# Patient Record
Sex: Female | Born: 1955 | Race: White | Hispanic: No | Marital: Married | Smoking: Never smoker
Health system: Southern US, Community
[De-identification: ages and names within clinical notes are randomized; demographics above are authoritative.]

## PROBLEM LIST (undated history)

## (undated) DIAGNOSIS — H269 Unspecified cataract: Secondary | ICD-10-CM

## (undated) DIAGNOSIS — F419 Anxiety disorder, unspecified: Secondary | ICD-10-CM

## (undated) DIAGNOSIS — K219 Gastro-esophageal reflux disease without esophagitis: Secondary | ICD-10-CM

## (undated) DIAGNOSIS — E785 Hyperlipidemia, unspecified: Secondary | ICD-10-CM

## (undated) DIAGNOSIS — I4891 Unspecified atrial fibrillation: Secondary | ICD-10-CM

## (undated) DIAGNOSIS — I10 Essential (primary) hypertension: Secondary | ICD-10-CM

## (undated) HISTORY — DX: Unspecified atrial fibrillation: I48.91

## (undated) HISTORY — DX: Essential (primary) hypertension: I10

## (undated) HISTORY — PX: BREAST SURGERY: SHX581

## (undated) HISTORY — DX: Gastro-esophageal reflux disease without esophagitis: K21.9

## (undated) HISTORY — DX: Hyperlipidemia, unspecified: E78.5

## (undated) HISTORY — DX: Anxiety disorder, unspecified: F41.9

## (undated) HISTORY — DX: Unspecified cataract: H26.9

## (undated) HISTORY — PX: EYE SURGERY: SHX253

## (undated) HISTORY — PX: COSMETIC SURGERY: SHX468

---

## 1995-03-11 HISTORY — PX: BREAST BIOPSY: SHX20

## 2004-03-10 HISTORY — PX: AUGMENTATION MAMMAPLASTY: SUR837

## 2013-10-14 DIAGNOSIS — I480 Paroxysmal atrial fibrillation: Secondary | ICD-10-CM | POA: Insufficient documentation

## 2013-10-14 DIAGNOSIS — I4891 Unspecified atrial fibrillation: Secondary | ICD-10-CM | POA: Insufficient documentation

## 2014-08-22 DIAGNOSIS — Z789 Other specified health status: Secondary | ICD-10-CM | POA: Insufficient documentation

## 2015-12-24 DIAGNOSIS — M5416 Radiculopathy, lumbar region: Secondary | ICD-10-CM | POA: Insufficient documentation

## 2017-01-09 LAB — HM COLONOSCOPY

## 2018-03-24 LAB — HM PAP SMEAR: HM Pap smear: NEGATIVE

## 2018-04-24 NOTE — Progress Notes (Signed)
Subjective:    Patient ID: Deborah Farmer, female    DOB: 14-Aug-1955, 63 y.o.   MRN: [id number]  HPI  Deborah Farmer is here to establish as a new pt.  She is a pleasant 63 year old female. PMH: AFib, HLD, GERD She reports A fib well controlled on Atenolol 59m QD and Flecainide 10662mBID.  She reports intermittent palpitations, denies CP/dyspnea She is followed by Cardiology annually She reports very limited caffeine intake and denies any tobacco/vape use She has been on rosuvastatin 62m80mD for HLD- denies myalgia's She reports significant life stress with caring for ailing 93 71ar old mother. Mother has CAD with PCI, AFib, T2D, Multiple Myeloma, and CHF- palliative has been initiated and "she have been given less than 6 months". She and her two other siblings are sharing "full care giver responsibility" for their mother and it has been increasingly exhausting. She reports a strained relationship with her husband of 6 years- he is now deaf, communication is difficult and he is unsupportive of her efforts to care for her mother. She denies regular exercise, however follows a heart healthy diet. [facility] using Alprazolam 0.262m23mry sparingly   Patient Care Team    Relationship Specialty Notifications Start End  DanfEsaw Grandchild PCP - General Family Medicine  04/26/18   AryaJalene Mullet Referring Physician Obstetrics and Gynecology  04/27/18   SchwNewell Coral Referring Physician Gastroenterology  04/27/18 04/27/18  MeehGeraldo Docker  Vascular Surgery  04/27/18 04/27/18    Patient Active Problem List   Diagnosis Date Noted  . Healthcare maintenance 04/27/2018  . Anxiety 04/27/2018  . Stress 04/27/2018  . Atrial fibrillation (HCC)[place]/18/2020  . Hyperlipidemia 04/27/2018     Past Medical History:  Diagnosis Date  . Atrial fibrillation (HCC)[place]. Hyperlipidemia      Past Surgical History:  Procedure Laterality Date  .  BREAST SURGERY     biopsy and augmentation     Family History  Problem Relation Age of Onset  . Heart attack Mother   . Hypertension Mother   . Congestive Heart Failure Mother   . Heart attack Father   . Diabetes Sister   . Diabetes Brother      Social History   Substance and Sexual Activity  Drug Use Never     Social History   Substance and Sexual Activity  Alcohol Use Yes  . Alcohol/week: 2.0 standard drinks  . Types: 2 Glasses of wine per week     Social History   Tobacco Use  Smoking Status Never Smoker  Smokeless Tobacco Never Used     Outpatient Encounter Medications as of 04/27/2018  Medication Sig  . ALPRAZolam (XANAX) 0.25 MG tablet Take 0.25 mg by mouth at bedtime as needed for anxiety.  . atMarland Kitchennolol (TENORMIN) 25 MG tablet Take 1 tablet by mouth daily.  . DEMarland KitchenILANT 60 MG capsule Take 1 capsule by mouth daily.  . famotidine (PEPCID) 40 MG tablet Take 1 tablet by mouth daily.  . flecainide (TAMBOCOR) 100 MG tablet Take 1 tablet by mouth 2 (two) times daily.  . Lactobacillus Rhamnosus, GG, (CULTURELLE) CAPS Take 1 capsule by mouth daily.  . Multiple Vitamins-Minerals (MULTIVITAMIN ADULT PO) Take 1 tablet by mouth daily.  . rosuvastatin (CRESTOR) 5 MG tablet Take 1 tablet by mouth daily.  . valACYclovir (VALTREX) 500 MG tablet Take by mouth as needed.   No facility-administered  encounter medications on file as of 04/27/2018.     Allergies: Patient has no allergy information on record.  Body mass index is 22.39 kg/m.  Blood pressure 118/74, pulse 66, temperature 99.2 F (37.3 C), temperature source Oral, height 5' 8.5" (1.74 m), weight 149 lb 6.4 oz (67.8 kg), SpO2 99 %.  Review of Systems  Constitutional: Positive for fatigue. Negative for activity change, appetite change, chills, diaphoresis, fever and unexpected weight change.  Eyes: Negative for visual disturbance.  Respiratory: Negative for cough, chest tightness, shortness of breath, wheezing  and stridor.   Cardiovascular: Negative for chest pain, palpitations and leg swelling.  Gastrointestinal: Negative for abdominal distention, abdominal pain, blood in stool, constipation, diarrhea, nausea and vomiting.  Endocrine: Negative for cold intolerance, heat intolerance, polydipsia, polyphagia and polyuria.  Genitourinary: Negative for difficulty urinating and flank pain.  Musculoskeletal: Positive for arthralgias and myalgias. Negative for back pain, neck pain and neck stiffness.  Skin: Negative for color change, pallor, rash and wound.  Neurological: Negative for dizziness and headaches.  Hematological: Does not bruise/bleed easily.  Psychiatric/Behavioral: Positive for dysphoric mood. Negative for agitation, behavioral problems, confusion, decreased concentration, hallucinations, self-injury, sleep disturbance and suicidal ideas. The patient is nervous/anxious. The patient is not hyperactive.        Objective:   Physical Exam Vitals signs and nursing note reviewed.  Constitutional:      General: She is not in acute distress.    Appearance: She is not ill-appearing, toxic-appearing or diaphoretic.  HENT:     Head: Normocephalic and atraumatic.  Cardiovascular:     Pulses: Normal pulses.     Heart sounds: Normal heart sounds. No murmur. No friction rub. No gallop.   Pulmonary:     Effort: Pulmonary effort is normal. No respiratory distress.     Breath sounds: Normal breath sounds. No stridor. No wheezing, rhonchi or rales.  Chest:     Chest wall: No tenderness.  Skin:    Capillary Refill: Capillary refill takes less than 2 seconds.  Neurological:     Mental Status: She is alert and oriented to person, place, and time.  Psychiatric:        Mood and Affect: Mood normal.        Behavior: Behavior normal.        Thought Content: Thought content normal.        Judgment: Judgment normal.       Assessment & Plan:   1. Healthcare maintenance   2. Need for hepatitis C  screening test   3. Screening for HIV (human immunodeficiency virus)   4. Screening for breast cancer   5. Hyperlipidemia, unspecified hyperlipidemia type   6. Stress   7. Anxiety   8. Atrial fibrillation, unspecified type [facility])     Healthcare maintenance Continue all medications as directed. Continue annual Cardiology follow-up. Remain well hydrated and follow Mediterranean diet. Behavioral Health referral placed. Increase regular exercise.  Recommend at least 30 minutes daily, 5 days per week of walking, jogging, biking, swimming, YouTube/Pinterest workout videos. Please schedule complete physical in 2 months, fasting lab appt the week prior.  Hyperlipidemia Currently on Rosuvastatin 27m QD Denies myalgia's   Atrial fibrillation (HCC) Well controlled on Atenolol 236mQD and Flecainide 10045mID.  She reports intermittent palpitations, denies CP/dyspnea She is followed by Cardiology annually    FOLLOW-UP:  Return in about 2 months (around 06/26/2018) for Fasting Labs, CPE.

## 2018-04-27 ENCOUNTER — Ambulatory Visit (INDEPENDENT_AMBULATORY_CARE_PROVIDER_SITE_OTHER): Payer: 59 | Admitting: Adult Health

## 2018-04-27 ENCOUNTER — Encounter: Payer: Self-pay | Admitting: Adult Health

## 2018-04-27 VITALS — BP 118/74 | HR 66 | Temp 99.2°F | Ht 68.5 in | Wt 149.4 lb

## 2018-04-27 DIAGNOSIS — F411 Generalized anxiety disorder: Secondary | ICD-10-CM | POA: Insufficient documentation

## 2018-04-27 DIAGNOSIS — E785 Hyperlipidemia, unspecified: Secondary | ICD-10-CM

## 2018-04-27 DIAGNOSIS — I4891 Unspecified atrial fibrillation: Secondary | ICD-10-CM

## 2018-04-27 DIAGNOSIS — Z0000 Encounter for general adult medical examination without abnormal findings: Secondary | ICD-10-CM

## 2018-04-27 DIAGNOSIS — Z1159 Encounter for screening for other viral diseases: Secondary | ICD-10-CM

## 2018-04-27 DIAGNOSIS — F419 Anxiety disorder, unspecified: Secondary | ICD-10-CM

## 2018-04-27 DIAGNOSIS — F439 Reaction to severe stress, unspecified: Secondary | ICD-10-CM

## 2018-04-27 DIAGNOSIS — Z1239 Encounter for other screening for malignant neoplasm of breast: Secondary | ICD-10-CM | POA: Diagnosis not present

## 2018-04-27 DIAGNOSIS — Z114 Encounter for screening for human immunodeficiency virus [HIV]: Secondary | ICD-10-CM

## 2018-04-27 NOTE — Assessment & Plan Note (Addendum)
Well controlled on Atenolol 25mg  QD and Flecainide 100mg  BID.  She reports intermittent palpitations, denies CP/dyspnea She is followed by Cardiology annually

## 2018-04-27 NOTE — Assessment & Plan Note (Signed)
Currently on Rosuvastatin 5mg  QD Denies myalgia's

## 2018-04-27 NOTE — Patient Instructions (Signed)
Mediterranean Diet A Mediterranean diet refers to food and lifestyle choices that are based on the traditions of countries located on the [employer]. This way of eating has been shown to help prevent certain conditions and improve outcomes for people who have chronic diseases, like kidney disease and heart disease. What are tips for following this plan? Lifestyle  Cook and eat meals together with your family, when possible.  Drink enough fluid to keep your urine clear or pale yellow.  Be physically active every day. This includes: ? Aerobic exercise like running or swimming. ? Leisure activities like gardening, walking, or housework.  Get 7-8 hours of sleep each night.  If recommended by your health care provider, drink red wine in moderation. This means 1 glass a day for nonpregnant women and 2 glasses a day for men. A glass of wine equals 5 oz (150 mL). Reading food labels   Check the serving size of packaged foods. For foods such as rice and pasta, the serving size refers to the amount of cooked product, not dry.  Check the total fat in packaged foods. Avoid foods that have saturated fat or trans fats.  Check the ingredients list for added sugars, such as corn syrup. Shopping  At the grocery store, buy most of your food from the areas near the walls of the store. This includes: ? Fresh fruits and vegetables (produce). ? Grains, beans, nuts, and seeds. Some of these may be available in unpackaged forms or large amounts (in bulk). ? Fresh seafood. ? Poultry and eggs. ? Low-fat dairy products.  Buy whole ingredients instead of prepackaged foods.  Buy fresh fruits and vegetables in-season from local farmers markets.  Buy frozen fruits and vegetables in resealable bags.  If you do not have access to quality fresh seafood, buy precooked frozen shrimp or canned fish, such as tuna, salmon, or sardines.  Buy small amounts of raw or cooked vegetables, salads, or olives from  the deli or salad bar at your store.  Stock your pantry so you always have certain foods on hand, such as olive oil, canned tuna, canned tomatoes, rice, pasta, and beans. Cooking  Cook foods with extra-virgin olive oil instead of using butter or other vegetable oils.  Have meat as a side dish, and have vegetables or grains as your main dish. This means having meat in small portions or adding small amounts of meat to foods like pasta or stew.  Use beans or vegetables instead of meat in common dishes like chili or lasagna.  Experiment with different cooking methods. Try roasting or broiling vegetables instead of steaming or sauteing them.  Add frozen vegetables to soups, stews, pasta, or rice.  Add nuts or seeds for added healthy fat at each meal. You can add these to yogurt, salads, or vegetable dishes.  Marinate fish or vegetables using olive oil, lemon juice, garlic, and fresh herbs. Meal planning   Plan to eat 1 vegetarian meal one day each week. Try to work up to 2 vegetarian meals, if possible.  Eat seafood 2 or more times a week.  Have healthy snacks readily available, such as: ? Vegetable sticks with hummus. ? [place] yogurt. ? Fruit and nut trail mix.  Eat balanced meals throughout the week. This includes: ? Fruit: 2-3 servings a day ? Vegetables: 4-5 servings a day ? Low-fat dairy: 2 servings a day ? Fish, poultry, or lean meat: 1 serving a day ? Beans and legumes: 2 or more servings a week ?  Nuts and seeds: 1-2 servings a day ? Whole grains: 6-8 servings a day ? Extra-virgin olive oil: 3-4 servings a day  Limit red meat and sweets to only a few servings a month What are my food choices?  Mediterranean diet ? Recommended ? Grains: Whole-grain pasta. Brown rice. Bulgar wheat. Polenta. Couscous. Whole-wheat bread. Orpah Cobb. ? Vegetables: Artichokes. Beets. Broccoli. Cabbage. Carrots. Eggplant. Green beans. Chard. Kale. Spinach. Onions. Leeks. Peas. Squash.  Tomatoes. Peppers. Radishes. ? Fruits: Apples. Apricots. Avocado. Berries. Bananas. Cherries. Dates. Figs. Grapes. Lemons. Melon. Oranges. Peaches. Plums. Pomegranate. ? Meats and other protein foods: Beans. Almonds. Sunflower seeds. Pine nuts. Peanuts. Cod. Salmon. Scallops. Shrimp. Tuna. Tilapia. Clams. Oysters. Eggs. ? Dairy: Low-fat milk. Cheese. Greek yogurt. ? Beverages: Water. Red wine. Herbal tea. ? Fats and oils: Extra virgin olive oil. Avocado oil. Grape seed oil. ? Sweets and desserts: [place] yogurt with honey. Baked apples. Poached pears. Trail mix. ? Seasoning and other foods: Basil. Cilantro. Coriander. Cumin. Mint. Parsley. Sage. Rosemary. Tarragon. Garlic. Oregano. Thyme. Pepper. Balsalmic vinegar. Tahini. Hummus. Tomato sauce. Olives. Mushrooms. ? Limit these ? Grains: Prepackaged pasta or rice dishes. Prepackaged cereal with added sugar. ? Vegetables: Deep fried potatoes (french fries). ? Fruits: Fruit canned in syrup. ? Meats and other protein foods: Beef. Pork. Lamb. Poultry with skin. Hot dogs. Tomasa Blase. ? Dairy: Ice cream. Sour cream. Whole milk. ? Beverages: Juice. Sugar-sweetened soft drinks. Beer. Liquor and spirits. ? Fats and oils: Butter. Canola oil. Vegetable oil. Beef fat (tallow). Lard. ? Sweets and desserts: Cookies. Cakes. Pies. Candy. ? Seasoning and other foods: Mayonnaise. Premade sauces and marinades. ? The items listed may not be a complete list. Talk with your dietitian about what dietary choices are right for you. Summary  The Mediterranean diet includes both food and lifestyle choices.  Eat a variety of fresh fruits and vegetables, beans, nuts, seeds, and whole grains.  Limit the amount of red meat and sweets that you eat.  Talk with your health care provider about whether it is safe for you to drink red wine in moderation. This means 1 glass a day for nonpregnant women and 2 glasses a day for men. A glass of wine equals 5 oz (150 mL). This information  is not intended to replace advice given to you by your health care provider. Make sure you discuss any questions you have with your health care provider. Document Released: 10/18/2015 Document Revised: 11/20/2015 Document Reviewed: 10/18/2015 Elsevier Interactive Patient Education  2019 [employer].  Continue all medications as directed. Continue annual Cardiology follow-up. Remain well hydrated and follow Mediterranean diet. Behavioral Health referral placed. Increase regular exercise.  Recommend at least 30 minutes daily, 5 days per week of walking, jogging, biking, swimming, YouTube/Pinterest workout videos. Please schedule complete physical in 2 months, fasting lab appt the week prior. WELCOME TO THE PRACTICE!

## 2018-04-27 NOTE — Assessment & Plan Note (Signed)
Continue all medications as directed. Continue annual Cardiology follow-up. Remain well hydrated and follow Mediterranean diet. Behavioral Health referral placed. Increase regular exercise.  Recommend at least 30 minutes daily, 5 days per week of walking, jogging, biking, swimming, YouTube/Pinterest workout videos. Please schedule complete physical in 2 months, fasting lab appt the week prior.

## 2018-06-30 ENCOUNTER — Other Ambulatory Visit: Payer: 59

## 2018-07-14 ENCOUNTER — Other Ambulatory Visit: Payer: 59

## 2018-07-20 ENCOUNTER — Encounter: Payer: 59 | Admitting: Adult Health

## 2018-07-26 LAB — HM MAMMOGRAPHY

## 2018-10-14 ENCOUNTER — Other Ambulatory Visit: Payer: Self-pay

## 2018-10-14 ENCOUNTER — Other Ambulatory Visit: Payer: 59

## 2018-10-14 DIAGNOSIS — E785 Hyperlipidemia, unspecified: Secondary | ICD-10-CM

## 2018-10-14 DIAGNOSIS — Z114 Encounter for screening for human immunodeficiency virus [HIV]: Secondary | ICD-10-CM

## 2018-10-14 DIAGNOSIS — Z0000 Encounter for general adult medical examination without abnormal findings: Secondary | ICD-10-CM

## 2018-10-14 DIAGNOSIS — Z1159 Encounter for screening for other viral diseases: Secondary | ICD-10-CM

## 2018-10-15 LAB — CBC WITH DIFFERENTIAL/PLATELET
Basophils Absolute: 0 10*3/uL (ref 0.0–0.2)
Basos: 1 %
EOS (ABSOLUTE): 0.1 10*3/uL (ref 0.0–0.4)
Eos: 1 %
Hematocrit: 36.6 % (ref 34.0–46.6)
Hemoglobin: 12.2 g/dL (ref 11.1–15.9)
Immature Grans (Abs): 0 10*3/uL (ref 0.0–0.1)
Immature Granulocytes: 0 %
Lymphocytes Absolute: 1.3 10*3/uL (ref 0.7–3.1)
Lymphs: 30 %
MCH: 29.3 pg (ref 26.6–33.0)
MCHC: 33.3 g/dL (ref 31.5–35.7)
MCV: 88 fL (ref 79–97)
Monocytes Absolute: 0.3 10*3/uL (ref 0.1–0.9)
Monocytes: 6 %
Neutrophils Absolute: 2.6 10*3/uL (ref 1.4–7.0)
Neutrophils: 62 %
Platelets: 193 10*3/uL (ref 150–450)
RBC: 4.16 x10E6/uL (ref 3.77–5.28)
RDW: 12 % (ref 11.7–15.4)
WBC: 4.2 10*3/uL (ref 3.4–10.8)

## 2018-10-15 LAB — HIV ANTIBODY (ROUTINE TESTING W REFLEX): HIV Screen 4th Generation wRfx: NONREACTIVE

## 2018-10-15 LAB — COMPREHENSIVE METABOLIC PANEL
ALT: 29 IU/L (ref 0–32)
AST: 35 IU/L (ref 0–40)
Albumin/Globulin Ratio: 2.1 (ref 1.2–2.2)
Albumin: 4.6 g/dL (ref 3.8–4.8)
Alkaline Phosphatase: 58 IU/L (ref 39–117)
BUN/Creatinine Ratio: 22 (ref 12–28)
BUN: 18 mg/dL (ref 8–27)
Bilirubin Total: 0.6 mg/dL (ref 0.0–1.2)
CO2: 27 mmol/L (ref 20–29)
Calcium: 9.6 mg/dL (ref 8.7–10.3)
Chloride: 102 mmol/L (ref 96–106)
Creatinine, Ser: 0.82 mg/dL (ref 0.57–1.00)
GFR calc Af Amer: 89 mL/min/{1.73_m2} (ref >59)
GFR calc non Af Amer: 77 mL/min/{1.73_m2} (ref >59)
Globulin, Total: 2.2 g/dL (ref 1.5–4.5)
Glucose: 94 mg/dL (ref 65–99)
Potassium: 4.6 mmol/L (ref 3.5–5.2)
Sodium: 142 mmol/L (ref 134–144)
Total Protein: 6.8 g/dL (ref 6.0–8.5)

## 2018-10-15 LAB — TSH: TSH: 0.918 u[IU]/mL (ref 0.450–4.500)

## 2018-10-15 LAB — LIPID PANEL
Chol/HDL Ratio: 3.4 ratio (ref 0.0–4.4)
Cholesterol, Total: 165 mg/dL (ref 100–199)
HDL: 49 mg/dL (ref >39)
LDL Calculated: 86 mg/dL (ref 0–99)
Triglycerides: 152 mg/dL — ABNORMAL HIGH (ref 0–149)
VLDL Cholesterol Cal: 30 mg/dL (ref 5–40)

## 2018-10-15 LAB — HEMOGLOBIN A1C
Est. average glucose Bld gHb Est-mCnc: 103 mg/dL
Hgb A1c MFr Bld: 5.2 % (ref 4.8–5.6)

## 2018-10-15 LAB — HEPATITIS C ANTIBODY: Hep C Virus Ab: <0.1 s/co ratio (ref 0.0–0.9)

## 2018-10-17 NOTE — Progress Notes (Signed)
Subjective:    Patient ID: Deborah Farmer, female    DOB: 1955/12/04, 63 y.o.   MRN: [id number]  HPI:04/27/2018 OV: Deborah Farmer is here to establish as a new pt.  She is a pleasant 63 year old female. PMH: AFib, HLD, GERD She reports A fib well controlled on Atenolol 62m QD and Flecainide 1077mBID.  She reports intermittent palpitations, denies CP/dyspnea She is followed by Cardiology annually She reports very limited caffeine intake and denies any tobacco/vape use She has been on rosuvastatin 77m777mD for HLD- denies myalgia's She reports significant life stress with caring for ailing 93 44ar old mother. Mother has CAD with PCI, AFib, T2D, Multiple Myeloma, and CHF- palliative has been initiated and "she have been given less than 6 months". She and her two other siblings are sharing "full care giver responsibility" for their mother and it has been increasingly exhausting. She reports a strained relationship with her husband of 6 years- he is now deaf, communication is difficult and he is unsupportive of her efforts to care for her mother. She denies regular exercise, however follows a heart healthy diet. [facility] using Alprazolam 0.277m30mry sparingly - 1/2 tablets per month    10/18/2018 OV: Deborah Farmer for CPE Her mother passed away 4 weeks ago, she is coping with the loss well. She has resumed walking 3-4 times/week, >1 mile She reports drinking "only a few sips of water/day" She follows heart healthy diet She was recently seen by cardiologist- no change to medication She is assisting with care of her 45 y61r old daughter-in-law: breast ca She reports improved relationship with her husband  10/14/2018 Labs: HIV- Neg  Hep C- Neg  TSH-WNL, 0.918  The 10-year ASCVD risk score (GofMikey BussingJr., et al., 2013) is: 2.6%  Values used to calculate the score:   Age: 89 y69rs   Sex: Female   Is Non-Hispanic African American: No    Diabetic: No   Tobacco smoker: No   Systolic Blood Pressure: 104 182g   Is BP treated: No   HDL Cholesterol: 49 mg/dL   Total Cholesterol: 165 mg/dL  LDL-86  A1c-WNL, 5.2  CMP-stable  CBC-stable Healthcare Maintenance: PAP-UTD, last 03/2018-normal Mammogram-Tracking Down report Colonoscopy-UTD, last 2018, repeat 10 years Immunizations-Shingrix updated today LDCT-N/A  Patient Care Team    Relationship Specialty Notifications Start End  DanfEsaw Grandchild PCP - General Family Medicine  04/26/18   AryaJalene Mullet Referring Physician Obstetrics and Gynecology  04/27/18     Patient Active Problem List   Diagnosis Date Noted  . Healthcare maintenance 04/27/2018  . Anxiety 04/27/2018  . Stress 04/27/2018  . Atrial fibrillation (HCC)[place]/18/2020  . Hyperlipidemia 04/27/2018     Past Medical History:  Diagnosis Date  . Atrial fibrillation (HCC)[place]. Hyperlipidemia      Past Surgical History:  Procedure Laterality Date  . BREAST SURGERY     biopsy and augmentation     Family History  Problem Relation Age of Onset  . Heart attack Mother   . Hypertension Mother   . Congestive Heart Failure Mother   . Heart attack Father   . Diabetes Sister   . Diabetes Brother      Social History   Substance and Sexual Activity  Drug Use Never     Social History   Substance and Sexual Activity  Alcohol Use Yes  . Alcohol/week: 2.0 standard drinks  .  Types: 2 Glasses of wine per week     Social History   Tobacco Use  Smoking Status Never Smoker  Smokeless Tobacco Never Used     Outpatient Encounter Medications as of 10/18/2018  Medication Sig  . atenolol (TENORMIN) 25 MG tablet Take 1 tablet by mouth daily.  Marland Kitchen DEXILANT 60 MG capsule Take 1 capsule by mouth daily.  . famotidine (PEPCID) 40 MG tablet Take 1 tablet by mouth daily.  . flecainide (TAMBOCOR) 100 MG tablet Take 1 tablet by mouth 2 (two) times daily.  . Lactobacillus Rhamnosus,  GG, (CULTURELLE) CAPS Take 1 capsule by mouth daily.  . Multiple Vitamins-Minerals (MULTIVITAMIN ADULT PO) Take 1 tablet by mouth daily.  . rosuvastatin (CRESTOR) 5 MG tablet Take 1 tablet by mouth daily.  . valACYclovir (VALTREX) 500 MG tablet Take by mouth as needed.  . [DISCONTINUED] ALPRAZolam (XANAX) 0.25 MG tablet Take 0.25 mg by mouth at bedtime as needed for anxiety.  . ALPRAZolam (NIRAVAM) 0.25 MG dissolvable tablet Take 1 tablet (0.25 mg total) by mouth at bedtime as needed for anxiety.   No facility-administered encounter medications on file as of 10/18/2018.     Allergies: Patient has no allergy information on record.  Body mass index is 22.57 kg/m.  Blood pressure 108/72, pulse (!) 55, temperature 98.6 F (37 C), temperature source Oral, height 5' 8.5" (1.74 m), weight 150 lb 9.6 oz (68.3 kg), SpO2 99 %.  Review of Systems  Constitutional: Positive for fatigue. Negative for activity change, appetite change, chills, diaphoresis, fever and unexpected weight change.  HENT: Negative for congestion.   Eyes: Negative for visual disturbance.  Respiratory: Negative for cough, chest tightness, shortness of breath, wheezing and stridor.   Cardiovascular: Negative for chest pain, palpitations and leg swelling.  Gastrointestinal: Negative for abdominal distention, abdominal pain, blood in stool, constipation, diarrhea, nausea and vomiting.  Endocrine: Negative for cold intolerance, heat intolerance, polydipsia, polyphagia and polyuria.  Genitourinary: Negative for difficulty urinating and flank pain.  Musculoskeletal: Negative for arthralgias, back pain, gait problem, joint swelling, myalgias, neck pain and neck stiffness.  Skin: Negative for color change, pallor, rash and wound.  Neurological: Negative for dizziness and headaches.  Hematological: Negative for adenopathy. Does not bruise/bleed easily.  Psychiatric/Behavioral: Negative for agitation, behavioral problems, confusion,  decreased concentration, dysphoric mood, hallucinations, self-injury, sleep disturbance and suicidal ideas. The patient is not nervous/anxious and is not hyperactive.        Objective:   Physical Exam Vitals signs and nursing note reviewed.  Constitutional:      General: She is not in acute distress.    Appearance: Normal appearance. She is normal weight. She is not ill-appearing, toxic-appearing or diaphoretic.  HENT:     Head: Normocephalic and atraumatic.     Right Ear: Tympanic membrane, ear canal and external ear normal. There is no impacted cerumen.     Left Ear: Tympanic membrane, ear canal and external ear normal. There is no impacted cerumen.     Nose: Nose normal. No congestion.     Mouth/Throat:     Mouth: Mucous membranes are dry.     Pharynx: No oropharyngeal exudate or posterior oropharyngeal erythema.  Eyes:     Extraocular Movements: Extraocular movements intact.     Conjunctiva/sclera: Conjunctivae normal.     Pupils: Pupils are equal, round, and reactive to light.  Neck:     Musculoskeletal: Normal range of motion and neck supple. No muscular tenderness.  Cardiovascular:  Rate and Rhythm: Normal rate and regular rhythm.     Pulses: Normal pulses.     Heart sounds: Normal heart sounds. No murmur. No gallop.   Pulmonary:     Effort: Pulmonary effort is normal. No respiratory distress.     Breath sounds: Normal breath sounds. No stridor. No wheezing, rhonchi or rales.  Chest:     Chest wall: No tenderness.  Abdominal:     General: Abdomen is flat. Bowel sounds are normal.     Palpations: Abdomen is soft.     Tenderness: There is no abdominal tenderness.  Musculoskeletal: Normal range of motion.  Skin:    General: Skin is warm.     Capillary Refill: Capillary refill takes less than 2 seconds.  Neurological:     Mental Status: She is alert and oriented to person, place, and time.  Psychiatric:        Mood and Affect: Mood normal.        Behavior: Behavior  normal.        Thought Content: Thought content normal.        Judgment: Judgment normal.        Assessment & Plan:   1. Encounter for well woman exam with routine gynecological exam   2. Healthcare maintenance   3. Need for zoster vaccination     Healthcare maintenance Continue all medications as directed. Increase water intake, strive for half of your body weight in ounces/day. Continue walking and add in yoga practice. Referral to local OB/GYN placed, someone from that practice will call you to schedule that appt. Follow-up in 6 months. Continue to social distance and wear a mask when in public. So very sorry for the loss of your mother.    FOLLOW-UP:  Return in about 6 months (around 04/20/2019) for Regular Follow Up, HTN.

## 2018-10-18 ENCOUNTER — Ambulatory Visit (INDEPENDENT_AMBULATORY_CARE_PROVIDER_SITE_OTHER): Payer: 59 | Admitting: Adult Health

## 2018-10-18 ENCOUNTER — Encounter: Payer: Self-pay | Admitting: Adult Health

## 2018-10-18 ENCOUNTER — Other Ambulatory Visit: Payer: Self-pay

## 2018-10-18 VITALS — BP 108/72 | HR 55 | Temp 98.6°F | Ht 68.5 in | Wt 150.6 lb

## 2018-10-18 DIAGNOSIS — Z23 Encounter for immunization: Secondary | ICD-10-CM

## 2018-10-18 DIAGNOSIS — Z01419 Encounter for gynecological examination (general) (routine) without abnormal findings: Secondary | ICD-10-CM | POA: Diagnosis not present

## 2018-10-18 DIAGNOSIS — Z0000 Encounter for general adult medical examination without abnormal findings: Secondary | ICD-10-CM

## 2018-10-18 MED ORDER — ALPRAZOLAM 0.25 MG PO TBDP: 0.2500 mg | ORAL_TABLET | Freq: Every evening | ORAL | 0 refills | Status: DC | PRN

## 2018-10-18 NOTE — Assessment & Plan Note (Signed)
Continue all medications as directed. Increase water intake, strive for half of your body weight in ounces/day. Continue walking and add in yoga practice. Referral to local OB/GYN placed, someone from that practice will call you to schedule that appt. Follow-up in 6 months. Continue to social distance and wear a mask when in public. So very sorry for the loss of your mother.

## 2018-10-18 NOTE — Patient Instructions (Signed)
Preventive Care for Adults, Female  A healthy lifestyle and preventive care can promote health and wellness. Preventive health guidelines for women include the following key practices.   A routine yearly physical is a good way to check with your health care provider about your health and preventive screening. It is a chance to share any concerns and updates on your health and to receive a thorough exam.   Visit your dentist for a routine exam and preventive care every 6 months. Brush your teeth twice a day and floss once a day. Good oral hygiene prevents tooth decay and gum disease.   The frequency of eye exams is based on your age, health, family medical history, use of contact lenses, and other factors. Follow your health care provider's recommendations for frequency of eye exams.   Eat a healthy diet. Foods like vegetables, fruits, whole grains, low-fat dairy products, and lean protein foods contain the nutrients you need without too many calories. Decrease your intake of foods high in solid fats, added sugars, and salt. Eat the right amount of calories for you.Get information about a proper diet from your health care provider, if necessary.   Regular physical exercise is one of the most important things you can do for your health. Most adults should get at least 150 minutes of moderate-intensity exercise (any activity that increases your heart rate and causes you to sweat) each week. In addition, most adults need muscle-strengthening exercises on 2 or more days a week.   Maintain a healthy weight. The body mass index (BMI) is a screening tool to identify possible weight problems. It provides an estimate of body fat based on height and weight. Your health care provider can find your BMI, and can help you achieve or maintain a healthy weight.For adults 20 years and older:   - A BMI below 18.5 is considered underweight.   - A BMI of 18.5 to 24.9 is normal.   - A BMI of 25 to 29.9 is  considered overweight.   - A BMI of 30 and above is considered obese.   Maintain normal blood lipids and cholesterol levels by exercising and minimizing your intake of trans and saturated fats.  Eat a balanced diet with plenty of fruit and vegetables. Blood tests for lipids and cholesterol should begin at age 20 and be repeated every 5 years minimum.  If your lipid or cholesterol levels are high, you are over 40, or you are at high risk for heart disease, you may need your cholesterol levels checked more frequently.Ongoing high lipid and cholesterol levels should be treated with medicines if diet and exercise are not working.   If you smoke, find out from your health care provider how to quit. If you do not use tobacco, do not start.   Lung cancer screening is recommended for adults aged 55-80 years who are at high risk for developing lung cancer because of a history of smoking. A yearly low-dose CT scan of the lungs is recommended for people who have at least a 30-pack-year history of smoking and are a current smoker or have quit within the past 15 years. A pack year of smoking is smoking an average of 1 pack of cigarettes a day for 1 year (for example: 1 pack a day for 30 years or 2 packs a day for 15 years). Yearly screening should continue until the smoker has stopped smoking for at least 15 years. Yearly screening should be stopped for people who develop a   health problem that would prevent them from having lung cancer treatment.   If you are pregnant, do not drink alcohol. If you are breastfeeding, be very cautious about drinking alcohol. If you are not pregnant and choose to drink alcohol, do not have more than 1 drink per day. One drink is considered to be 12 ounces (355 mL) of beer, 5 ounces (148 mL) of wine, or 1.5 ounces (44 mL) of liquor.   Avoid use of street drugs. Do not share needles with anyone. Ask for help if you need support or instructions about stopping the use of  drugs.   High blood pressure causes heart disease and increases the risk of stroke. Your blood pressure should be checked at least yearly.  Ongoing high blood pressure should be treated with medicines if weight loss and exercise do not work.   If you are 69-55 years old, ask your health care provider if you should take aspirin to prevent strokes.   Diabetes screening involves taking a blood sample to check your fasting blood sugar level. This should be done once every 3 years, after age 38, if you are within normal weight and without risk factors for diabetes. Testing should be considered at a younger age or be carried out more frequently if you are overweight and have at least 1 risk factor for diabetes.   Breast cancer screening is essential preventive care for women. You should practice "breast self-awareness."  This means understanding the normal appearance and feel of your breasts and may include breast self-examination.  Any changes detected, no matter how small, should be reported to a health care provider.  Women in their 80s and 30s should have a clinical breast exam (CBE) by a health care provider as part of a regular health exam every 1 to 3 years.  After age 66, women should have a CBE every year.  Starting at age 1, women should consider having a mammogram (breast X-ray test) every year.  Women who have a family history of breast cancer should talk to their health care provider about genetic screening.  Women at a high risk of breast cancer should talk to their health care providers about having an MRI and a mammogram every year.   -Breast cancer gene (BRCA)-related cancer risk assessment is recommended for women who have family members with BRCA-related cancers. BRCA-related cancers include breast, ovarian, tubal, and peritoneal cancers. Having family members with these cancers may be associated with an increased risk for harmful changes (mutations) in the breast cancer genes BRCA1 and  BRCA2. Results of the assessment will determine the need for genetic counseling and BRCA1 and BRCA2 testing.   The Pap test is a screening test for cervical cancer. A Pap test can show cell changes on the cervix that might become cervical cancer if left untreated. A Pap test is a procedure in which cells are obtained and examined from the lower end of the uterus (cervix).   - Women should have a Pap test starting at age 57.   - Between ages 90 and 70, Pap tests should be repeated every 2 years.   - Beginning at age 63, you should have a Pap test every 3 years as long as the past 3 Pap tests have been normal.   - Some women have medical problems that increase the chance of getting cervical cancer. Talk to your health care provider about these problems. It is especially important to talk to your health care provider if a  new problem develops soon after your last Pap test. In these cases, your health care provider may recommend more frequent screening and Pap tests.   - The above recommendations are the same for women who have or have not gotten the vaccine for human papillomavirus (HPV).   - If you had a hysterectomy for a problem that was not cancer or a condition that could lead to cancer, then you no longer need Pap tests. Even if you no longer need a Pap test, a regular exam is a good idea to make sure no other problems are starting.   - If you are between ages 36 and 66 years, and you have had normal Pap tests going back 10 years, you no longer need Pap tests. Even if you no longer need a Pap test, a regular exam is a good idea to make sure no other problems are starting.   - If you have had past treatment for cervical cancer or a condition that could lead to cancer, you need Pap tests and screening for cancer for at least 20 years after your treatment.   - If Pap tests have been discontinued, risk factors (such as a new sexual partner) need to be reassessed to determine if screening should  be resumed.   - The HPV test is an additional test that may be used for cervical cancer screening. The HPV test looks for the virus that can cause the cell changes on the cervix. The cells collected during the Pap test can be tested for HPV. The HPV test could be used to screen women aged 70 years and older, and should be used in women of any age who have unclear Pap test results. After the age of 67, women should have HPV testing at the same frequency as a Pap test.   Colorectal cancer can be detected and often prevented. Most routine colorectal cancer screening begins at the age of 57 years and continues through age 26 years. However, your health care provider may recommend screening at an earlier age if you have risk factors for colon cancer. On a yearly basis, your health care provider may provide home test kits to check for hidden blood in the stool.  Use of a small camera at the end of a tube, to directly examine the colon (sigmoidoscopy or colonoscopy), can detect the earliest forms of colorectal cancer. Talk to your health care provider about this at age 23, when routine screening begins. Direct exam of the colon should be repeated every 5 -10 years through age 49 years, unless early forms of pre-cancerous polyps or small growths are found.   People who are at an increased risk for hepatitis B should be screened for this virus. You are considered at high risk for hepatitis B if:  -You were born in a country where hepatitis B occurs often. Talk with your health care provider about which countries are considered high risk.  - Your parents were born in a high-risk country and you have not received a shot to protect against hepatitis B (hepatitis B vaccine).  - You have HIV or AIDS.  - You use needles to inject street drugs.  - You live with, or have sex with, someone who has Hepatitis B.  - You get hemodialysis treatment.  - You take certain medicines for conditions like cancer, organ  transplantation, and autoimmune conditions.   Hepatitis C blood testing is recommended for all people born from [birth year] through 1965 and any individual  with known risks for hepatitis C.   Practice safe sex. Use condoms and avoid high-risk sexual practices to reduce the spread of sexually transmitted infections (STIs). STIs include gonorrhea, chlamydia, syphilis, trichomonas, herpes, HPV, and human immunodeficiency virus (HIV). Herpes, HIV, and HPV are viral illnesses that have no cure. They can result in disability, cancer, and death. Sexually active women aged 25 years and younger should be checked for chlamydia. Older women with new or multiple partners should also be tested for chlamydia. Testing for other STIs is recommended if you are sexually active and at increased risk.   Osteoporosis is a disease in which the bones lose minerals and strength with aging. This can result in serious bone fractures or breaks. The risk of osteoporosis can be identified using a bone density scan. Women ages 65 years and over and women at risk for fractures or osteoporosis should discuss screening with their health care providers. Ask your health care provider whether you should take a calcium supplement or vitamin D to There are also several preventive steps women can take to avoid osteoporosis and resulting fractures or to keep osteoporosis from worsening. -->Recommendations include:  Eat a balanced diet high in fruits, vegetables, calcium, and vitamins.  Get enough calcium. The recommended total intake of is 1,200 mg daily; for best absorption, if taking supplements, divide doses into 250-500 mg doses throughout the day. Of the two types of calcium, calcium carbonate is best absorbed when taken with food but calcium citrate can be taken on an empty stomach.  Get enough vitamin D. NAMS and the National Osteoporosis Foundation recommend at least 1,000 IU per day for women age 50 and over who are at risk of vitamin D  deficiency. Vitamin D deficiency can be caused by inadequate sun exposure (for example, those who live in northern latitudes).  Avoid alcohol and smoking. Heavy alcohol intake (more than 7 drinks per week) increases the risk of falls and hip fracture and women smokers tend to lose bone more rapidly and have lower bone mass than nonsmokers. Stopping smoking is one of the most important changes women can make to improve their health and decrease risk for disease.  Be physically active every day. Weight-bearing exercise (for example, fast walking, hiking, jogging, and weight training) may strengthen bones or slow the rate of bone loss that comes with aging. Balancing and muscle-strengthening exercises can reduce the risk of falling and fracture.  Consider therapeutic medications. Currently, several types of effective drugs are available. Healthcare providers can recommend the type most appropriate for each woman.  Eliminate environmental factors that may contribute to accidents. Falls cause nearly 90% of all osteoporotic fractures, so reducing this risk is an important bone-health strategy. Measures include ample lighting, removing obstructions to walking, using nonskid rugs on floors, and placing mats and/or grab bars in showers.  Be aware of medication side effects. Some common medicines make bones weaker. These include a type of steroid drug called glucocorticoids used for arthritis and asthma, some antiseizure drugs, certain sleeping pills, treatments for endometriosis, and some cancer drugs. An overactive thyroid gland or using too much thyroid hormone for an underactive thyroid can also be a problem. If you are taking these medicines, talk to your doctor about what you can do to help protect your bones.reduce the rate of osteoporosis.    Menopause can be associated with physical symptoms and risks. Hormone replacement therapy is available to decrease symptoms and risks. You should talk to your  health care provider   about whether hormone replacement therapy is right for you.   Use sunscreen. Apply sunscreen liberally and repeatedly throughout the day. You should seek shade when your shadow is shorter than you. Protect yourself by wearing long sleeves, pants, a wide-brimmed hat, and sunglasses year round, whenever you are outdoors.   Once a month, do a whole body skin exam, using a mirror to look at the skin on your back. Tell your health care provider of new moles, moles that have irregular borders, moles that are larger than a pencil eraser, or moles that have changed in shape or color.   -Stay current with required vaccines (immunizations).   Influenza vaccine. All adults should be immunized every year.  Tetanus, diphtheria, and acellular pertussis (Td, Tdap) vaccine. Pregnant women should receive 1 dose of Tdap vaccine during each pregnancy. The dose should be obtained regardless of the length of time since the last dose. Immunization is preferred during the 27th 36th week of gestation. An adult who has not previously received Tdap or who does not know her vaccine status should receive 1 dose of Tdap. This initial dose should be followed by tetanus and diphtheria toxoids (Td) booster doses every 10 years. Adults with an unknown or incomplete history of completing a 3-dose immunization series with Td-containing vaccines should begin or complete a primary immunization series including a Tdap dose. Adults should receive a Td booster every 10 years.  Varicella vaccine. An adult without evidence of immunity to varicella should receive 2 doses or a second dose if she has previously received 1 dose. Pregnant females who do not have evidence of immunity should receive the first dose after pregnancy. This first dose should be obtained before leaving the health care facility. The second dose should be obtained 4 8 weeks after the first dose.  Human papillomavirus (HPV) vaccine. Females aged 13 26  years who have not received the vaccine previously should obtain the 3-dose series. The vaccine is not recommended for use in pregnant females. However, pregnancy testing is not needed before receiving a dose. If a female is found to be pregnant after receiving a dose, no treatment is needed. In that case, the remaining doses should be delayed until after the pregnancy. Immunization is recommended for any person with an immunocompromised condition through the age of 26 years if she did not get any or all doses earlier. During the 3-dose series, the second dose should be obtained 4 8 weeks after the first dose. The third dose should be obtained 24 weeks after the first dose and 16 weeks after the second dose.  Zoster vaccine. One dose is recommended for adults aged 60 years or older unless certain conditions are present.  Measles, mumps, and rubella (MMR) vaccine. Adults born before 1957 generally are considered immune to measles and mumps. Adults born in 1957 or later should have 1 or more doses of MMR vaccine unless there is a contraindication to the vaccine or there is laboratory evidence of immunity to each of the three diseases. A routine second dose of MMR vaccine should be obtained at least 28 days after the first dose for students attending postsecondary schools, health care workers, or international travelers. People who received inactivated measles vaccine or an unknown type of measles vaccine during 1963 1967 should receive 2 doses of MMR vaccine. People who received inactivated mumps vaccine or an unknown type of mumps vaccine before 1979 and are at high risk for mumps infection should consider immunization with 2 doses of   MMR vaccine. For females of childbearing age, rubella immunity should be determined. If there is no evidence of immunity, females who are not pregnant should be vaccinated. If there is no evidence of immunity, females who are pregnant should delay immunization until after pregnancy.  Unvaccinated health care workers born before [birth year] who lack laboratory evidence of measles, mumps, or rubella immunity or laboratory confirmation of disease should consider measles and mumps immunization with 2 doses of MMR vaccine or rubella immunization with 1 dose of MMR vaccine.  Pneumococcal 13-valent conjugate (PCV13) vaccine. When indicated, a person who is uncertain of her immunization history and has no record of immunization should receive the PCV13 vaccine. An adult aged 54 years or older who has certain medical conditions and has not been previously immunized should receive 1 dose of PCV13 vaccine. This PCV13 should be followed with a dose of pneumococcal polysaccharide (PPSV23) vaccine. The PPSV23 vaccine dose should be obtained at least 8 weeks after the dose of PCV13 vaccine. An adult aged 58 years or older who has certain medical conditions and previously received 1 or more doses of PPSV23 vaccine should receive 1 dose of PCV13. The PCV13 vaccine dose should be obtained 1 or more years after the last PPSV23 vaccine dose.  Pneumococcal polysaccharide (PPSV23) vaccine. When PCV13 is also indicated, PCV13 should be obtained first. All adults aged 58 years and older should be immunized. An adult younger than age 65 years who has certain medical conditions should be immunized. Any person who resides in a nursing home or long-term care facility should be immunized. An adult smoker should be immunized. People with an immunocompromised condition and certain other conditions should receive both PCV13 and PPSV23 vaccines. People with human immunodeficiency virus (HIV) infection should be immunized as soon as possible after diagnosis. Immunization during chemotherapy or radiation therapy should be avoided. Routine use of PPSV23 vaccine is not recommended for American Indians, [place] Natives, or people younger than 65 years unless there are medical conditions that require PPSV23 vaccine. When indicated,  people who have unknown immunization and have no record of immunization should receive PPSV23 vaccine. One-time revaccination 5 years after the first dose of PPSV23 is recommended for people aged 70 64 years who have chronic kidney failure, nephrotic syndrome, asplenia, or immunocompromised conditions. People who received 1 2 doses of PPSV23 before age 32 years should receive another dose of PPSV23 vaccine at age 96 years or later if at least 5 years have passed since the previous dose. Doses of PPSV23 are not needed for people immunized with PPSV23 at or after age 55 years.  Meningococcal vaccine. Adults with asplenia or persistent complement component deficiencies should receive 2 doses of quadrivalent meningococcal conjugate (MenACWY-D) vaccine. The doses should be obtained at least 2 months apart. Microbiologists working with certain meningococcal bacteria, [employer] recruits, people at risk during an outbreak, and people who travel to or live in countries with a high rate of meningitis should be immunized. A first-year college student up through age 58 years who is living in a residence hall should receive a dose if she did not receive a dose on or after her 16th birthday. Adults who have certain high-risk conditions should receive one or more doses of vaccine.  Hepatitis A vaccine. Adults who wish to be protected from this disease, have certain high-risk conditions, work with hepatitis A-infected animals, work in hepatitis A research labs, or travel to or work in countries with a high rate of hepatitis A should be  immunized. Adults who were previously unvaccinated and who anticipate close contact with an international adoptee during the first 60 days after arrival in the [place] from a country with a high rate of hepatitis A should be immunized.  Hepatitis B vaccine.  Adults who wish to be protected from this disease, have certain high-risk conditions, may be exposed to blood or other infectious  body fluids, are household contacts or sex partners of hepatitis B positive people, are clients or workers in certain care facilities, or travel to or work in countries with a high rate of hepatitis B should be immunized.  Haemophilus influenzae type b (Hib) vaccine. A previously unvaccinated person with asplenia or sickle cell disease or having a scheduled splenectomy should receive 1 dose of Hib vaccine. Regardless of previous immunization, a recipient of a hematopoietic stem cell transplant should receive a 3-dose series 6 12 months after her successful transplant. Hib vaccine is not recommended for adults with HIV infection.  Preventive Services / Frequency Ages 6 to 39years  Blood pressure check.** / Every 1 to 2 years.  Lipid and cholesterol check.** / Every 5 years beginning at age 39.  Clinical breast exam.** / Every 3 years for women in their 61s and 62s.  BRCA-related cancer risk assessment.** / For women who have family members with a BRCA-related cancer (breast, ovarian, tubal, or peritoneal cancers).  Pap test.** / Every 2 years from ages 47 through 85. Every 3 years starting at age 34 through age 12 or 74 with a history of 3 consecutive normal Pap tests.  HPV screening.** / Every 3 years from ages 46 through ages 43 to 54 with a history of 3 consecutive normal Pap tests.  Hepatitis C blood test.** / For any individual with known risks for hepatitis C.  Skin self-exam. / Monthly.  Influenza vaccine. / Every year.  Tetanus, diphtheria, and acellular pertussis (Tdap, Td) vaccine.** / Consult your health care provider. Pregnant women should receive 1 dose of Tdap vaccine during each pregnancy. 1 dose of Td every 10 years.  Varicella vaccine.** / Consult your health care provider. Pregnant females who do not have evidence of immunity should receive the first dose after pregnancy.  HPV vaccine. / 3 doses over 6 months, if 64 and younger. The vaccine is not recommended for use in  pregnant females. However, pregnancy testing is not needed before receiving a dose.  Measles, mumps, rubella (MMR) vaccine.** / You need at least 1 dose of MMR if you were born in 1957 or later. You may also need a 2nd dose. For females of childbearing age, rubella immunity should be determined. If there is no evidence of immunity, females who are not pregnant should be vaccinated. If there is no evidence of immunity, females who are pregnant should delay immunization until after pregnancy.  Pneumococcal 13-valent conjugate (PCV13) vaccine.** / Consult your health care provider.  Pneumococcal polysaccharide (PPSV23) vaccine.** / 1 to 2 doses if you smoke cigarettes or if you have certain conditions.  Meningococcal vaccine.** / 1 dose if you are age 71 to 37 years and a Market researcher living in a residence hall, or have one of several medical conditions, you need to get vaccinated against meningococcal disease. You may also need additional booster doses.  Hepatitis A vaccine.** / Consult your health care provider.  Hepatitis B vaccine.** / Consult your health care provider.  Haemophilus influenzae type b (Hib) vaccine.** / Consult your health care provider.  Ages 55 to 64years  Blood pressure check.** / Every 1 to 2 years.  Lipid and cholesterol check.** / Every 5 years beginning at age 20 years.  Lung cancer screening. / Every year if you are aged 55 80 years and have a 30-pack-year history of smoking and currently smoke or have quit within the past 15 years. Yearly screening is stopped once you have quit smoking for at least 15 years or develop a health problem that would prevent you from having lung cancer treatment.  Clinical breast exam.** / Every year after age 40 years.  BRCA-related cancer risk assessment.** / For women who have family members with a BRCA-related cancer (breast, ovarian, tubal, or peritoneal cancers).  Mammogram.** / Every year beginning at age 40  years and continuing for as long as you are in good health. Consult with your health care provider.  Pap test.** / Every 3 years starting at age 30 years through age 65 or 70 years with a history of 3 consecutive normal Pap tests.  HPV screening.** / Every 3 years from ages 30 years through ages 65 to 70 years with a history of 3 consecutive normal Pap tests.  Fecal occult blood test (FOBT) of stool. / Every year beginning at age 50 years and continuing until age 75 years. You may not need to do this test if you get a colonoscopy every 10 years.  Flexible sigmoidoscopy or colonoscopy.** / Every 5 years for a flexible sigmoidoscopy or every 10 years for a colonoscopy beginning at age 50 years and continuing until age 75 years.  Hepatitis C blood test.** / For all people born from 1945 through 1965 and any individual with known risks for hepatitis C.  Skin self-exam. / Monthly.  Influenza vaccine. / Every year.  Tetanus, diphtheria, and acellular pertussis (Tdap/Td) vaccine.** / Consult your health care provider. Pregnant women should receive 1 dose of Tdap vaccine during each pregnancy. 1 dose of Td every 10 years.  Varicella vaccine.** / Consult your health care provider. Pregnant females who do not have evidence of immunity should receive the first dose after pregnancy.  Zoster vaccine.** / 1 dose for adults aged 60 years or older.  Measles, mumps, rubella (MMR) vaccine.** / You need at least 1 dose of MMR if you were born in 1957 or later. You may also need a 2nd dose. For females of childbearing age, rubella immunity should be determined. If there is no evidence of immunity, females who are not pregnant should be vaccinated. If there is no evidence of immunity, females who are pregnant should delay immunization until after pregnancy.  Pneumococcal 13-valent conjugate (PCV13) vaccine.** / Consult your health care provider.  Pneumococcal polysaccharide (PPSV23) vaccine.** / 1 to 2 doses if  you smoke cigarettes or if you have certain conditions.  Meningococcal vaccine.** / Consult your health care provider.  Hepatitis A vaccine.** / Consult your health care provider.  Hepatitis B vaccine.** / Consult your health care provider.  Haemophilus influenzae type b (Hib) vaccine.** / Consult your health care provider.  Ages 65 years and over  Blood pressure check.** / Every 1 to 2 years.  Lipid and cholesterol check.** / Every 5 years beginning at age 20 years.  Lung cancer screening. / Every year if you are aged 55 80 years and have a 30-pack-year history of smoking and currently smoke or have quit within the past 15 years. Yearly screening is stopped once you have quit smoking for at least 15 years or develop a health problem that   would prevent you from having lung cancer treatment.  Clinical breast exam.** / Every year after age 103 years.  BRCA-related cancer risk assessment.** / For women who have family members with a BRCA-related cancer (breast, ovarian, tubal, or peritoneal cancers).  Mammogram.** / Every year beginning at age 36 years and continuing for as long as you are in good health. Consult with your health care provider.  Pap test.** / Every 3 years starting at age 5 years through age 85 or 10 years with 3 consecutive normal Pap tests. Testing can be stopped between 65 and 70 years with 3 consecutive normal Pap tests and no abnormal Pap or HPV tests in the past 10 years.  HPV screening.** / Every 3 years from ages 93 years through ages 70 or 45 years with a history of 3 consecutive normal Pap tests. Testing can be stopped between 65 and 70 years with 3 consecutive normal Pap tests and no abnormal Pap or HPV tests in the past 10 years.  Fecal occult blood test (FOBT) of stool. / Every year beginning at age 8 years and continuing until age 45 years. You may not need to do this test if you get a colonoscopy every 10 years.  Flexible sigmoidoscopy or colonoscopy.** /  Every 5 years for a flexible sigmoidoscopy or every 10 years for a colonoscopy beginning at age 69 years and continuing until age 68 years.  Hepatitis C blood test.** / For all people born from [birth year] through 1965 and any individual with known risks for hepatitis C.  Osteoporosis screening.** / A one-time screening for women ages 7 years and over and women at risk for fractures or osteoporosis.  Skin self-exam. / Monthly.  Influenza vaccine. / Every year.  Tetanus, diphtheria, and acellular pertussis (Tdap/Td) vaccine.** / 1 dose of Td every 10 years.  Varicella vaccine.** / Consult your health care provider.  Zoster vaccine.** / 1 dose for adults aged 5 years or older.  Pneumococcal 13-valent conjugate (PCV13) vaccine.** / Consult your health care provider.  Pneumococcal polysaccharide (PPSV23) vaccine.** / 1 dose for all adults aged 74 years and older.  Meningococcal vaccine.** / Consult your health care provider.  Hepatitis A vaccine.** / Consult your health care provider.  Hepatitis B vaccine.** / Consult your health care provider.  Haemophilus influenzae type b (Hib) vaccine.** / Consult your health care provider. ** Family history and personal history of risk and conditions may change your health care provider's recommendations. Document Released: 04/22/2001 Document Revised: 12/15/2012  [facility] Patient Information 2014 [place], [place].   EXERCISE AND DIET:  We recommended that you start or continue a regular exercise program for good health. Regular exercise means any activity that makes your heart beat faster and makes you sweat.  We recommend exercising at least 30 minutes per day at least 3 days a week, preferably 5.  We also recommend a diet low in fat and sugar / carbohydrates.  Inactivity, poor dietary choices and obesity can cause diabetes, heart attack, stroke, and kidney damage, among others.     ALCOHOL AND SMOKING:  Women should limit their alcohol intake to no  more than 7 drinks/beers/glasses of wine (combined, not each!) per week. Moderation of alcohol intake to this level decreases your risk of breast cancer and liver damage.  ( And of course, no recreational drugs are part of a healthy lifestyle.)  Also, you should not be smoking at all or even being exposed to second hand smoke. Most people know smoking can  cause cancer, and various heart and lung diseases, but did you know it also contributes to weakening of your bones?  Aging of your skin?  Yellowing of your teeth and nails?   CALCIUM AND VITAMIN D:  Adequate intake of calcium and Vitamin D are recommended.  The recommendations for exact amounts of these supplements seem to change often, but generally speaking 600 mg of calcium (either carbonate or citrate) and 800 units of Vitamin D per day seems prudent. Certain women may benefit from higher intake of Vitamin D.  If you are among these women, your doctor will have told you during your visit.     PAP SMEARS:  Pap smears, to check for cervical cancer or precancers,  have traditionally been done yearly, although recent scientific advances have shown that most women can have pap smears less often.  However, every woman still should have a physical exam from her gynecologist or primary care physician every year. It will include a breast check, inspection of the vulva and vagina to check for abnormal growths or skin changes, a visual exam of the cervix, and then an exam to evaluate the size and shape of the uterus and ovaries.  And after 63 years of age, a rectal exam is indicated to check for rectal cancers. We will also provide age appropriate advice regarding health maintenance, like when you should have certain vaccines, screening for sexually transmitted diseases, bone density testing, colonoscopy, mammograms, etc.    MAMMOGRAMS:  All women over 6 years old should have a yearly mammogram. Many facilities now offer a "3D" mammogram, which may cost  around $50 extra out of pocket. If possible,  we recommend you accept the option to have the 3D mammogram performed.  It both reduces the number of women who will be called back for extra views which then turn out to be normal, and it is better than the routine mammogram at detecting truly abnormal areas.     COLONOSCOPY:  Colonoscopy to screen for colon cancer is recommended for all women at age 82.  We know, you hate the idea of the prep.  We agree, BUT, having colon cancer and not knowing it is worse!!  Colon cancer so often starts as a polyp that can be seen and removed at colonscopy, which can quite literally save your life!  And if your first colonoscopy is normal and you have no family history of colon cancer, most women don't have to have it again for 10 years.  Once every ten years, you can do something that may end up saving your life, right?  We will be happy to help you get it scheduled when you are ready.  Be sure to check your insurance coverage so you understand how much it will cost.  It may be covered as a preventative service at no cost, but you should check your particular policy.    Continue all medications as directed. Increase water intake, strive for half of your body weight in ounces/day. Continue walking and add in yoga practice. Referral to local OB/GYN placed, someone from that practice will call you to schedule that appt. Follow-up in 6 months. Continue to social distance and wear a mask when in public. So very sorry for the loss of your mother. GREAT TO SEE YOU!

## 2018-10-26 ENCOUNTER — Encounter: Payer: Self-pay | Admitting: Adult Health

## 2019-01-12 ENCOUNTER — Ambulatory Visit: Payer: 59

## 2019-01-18 ENCOUNTER — Ambulatory Visit: Payer: 59

## 2019-01-19 ENCOUNTER — Telehealth: Payer: Self-pay | Admitting: Adult Health

## 2019-01-19 NOTE — Telephone Encounter (Signed)
Patient wanted to make sure it's alright to have 2nd shingle vaccine shot on 11/16 given she just had a Steroid injection on 11/4--pt had bad side effects last time & wanted to be advised on what to do.  --glh

## 2019-01-19 NOTE — Telephone Encounter (Signed)
Pt stated that her previous Shingrix gave her chills and headaches and general malaise.  Advised pt that this is not an uncommon side effect.  Advised pt that she may postpone 2nd shingles vaccine until mid to late December since she just had a steroid joint injection.  However, also advised pt that she would need to get the second shingrix by 04/2019 or she would have to start the series over.  Pt expressed understanding and is agreeable.  Charyl Bigger, CMA

## 2019-01-21 ENCOUNTER — Other Ambulatory Visit: Payer: Self-pay

## 2019-01-21 DIAGNOSIS — Z20822 Contact with and (suspected) exposure to covid-19: Secondary | ICD-10-CM

## 2019-01-24 ENCOUNTER — Encounter: Payer: Self-pay | Admitting: Adult Health

## 2019-01-24 ENCOUNTER — Ambulatory Visit: Payer: 59

## 2019-01-24 LAB — NOVEL CORONAVIRUS, NAA: SARS-CoV-2, NAA: NOT DETECTED

## 2019-02-21 ENCOUNTER — Encounter: Payer: Self-pay | Admitting: Adult Health

## 2019-02-21 ENCOUNTER — Other Ambulatory Visit: Payer: Self-pay

## 2019-02-21 ENCOUNTER — Ambulatory Visit: Payer: 59 | Admitting: Adult Health

## 2019-02-21 VITALS — BP 135/72 | HR 67 | Temp 98.6°F | Ht 68.5 in | Wt 151.9 lb

## 2019-02-21 DIAGNOSIS — F439 Reaction to severe stress, unspecified: Secondary | ICD-10-CM

## 2019-02-21 DIAGNOSIS — Z0000 Encounter for general adult medical examination without abnormal findings: Secondary | ICD-10-CM

## 2019-02-21 DIAGNOSIS — E785 Hyperlipidemia, unspecified: Secondary | ICD-10-CM | POA: Diagnosis not present

## 2019-02-21 DIAGNOSIS — I4891 Unspecified atrial fibrillation: Secondary | ICD-10-CM

## 2019-02-21 DIAGNOSIS — F419 Anxiety disorder, unspecified: Secondary | ICD-10-CM

## 2019-02-21 NOTE — Assessment & Plan Note (Addendum)
Continue all medications as directed. Remain well hydrated, follow a heart healthy diet. Follow-up with your established Cardiologist for further evaluation/work-up. If you experience severe chest pain/chest pressure/chest heaviness/shortness of breath with significantly elevated blood pressure-seek immediate medical care. Referral to [facility] Health/Psychology placed, re: Anxiety. Continue to social distance and wear a mask when in public. Follow-up here as needed.

## 2019-02-21 NOTE — Assessment & Plan Note (Signed)
>>  ASSESSMENT AND PLAN FOR ANXIETY WRITTEN ON 02/21/2019  5:16 PM BY DANFORD, Berna Spare, NP  Referral to [facility] Health/Psychology placed, re: Anxiety.

## 2019-02-21 NOTE — Assessment & Plan Note (Signed)
Referral to [facility] Health/Psychology placed, re: Anxiety.

## 2019-02-21 NOTE — Assessment & Plan Note (Signed)
Rosuvastatin 5mg  QD

## 2019-02-21 NOTE — Assessment & Plan Note (Addendum)
Atenolol 25mg  QD, Flecainide 100mg  QD Follow-up with your established Cardiologist for further evaluation/work-up.

## 2019-02-21 NOTE — Progress Notes (Signed)
Subjective:    Patient ID: Deborah Farmer, female    DOB: 1956/02/13, 63 y.o.   MRN: [id number]  HPI: Deborah Farmer is here for concerns and about elevated BP that occurred 4 days ago. She reports working in the yard strenuously, then felt "off", she checked her BP- quite elevated readings: SBP 160-180 DBP 80 She denies any CP/chest tightness with exertion at that time or at present. She reports waking the next morning with tension HA, described as pressure, rated 7/10. She denies change in vision at that time. She took 1/2 Alprazolam 0..25mg - was able to return to sleep and BP was much improved the next morning. Her BP the last 3 days SBP 100-140, mean low 120s DBP 70  She is currently on Atenolol 25mg  QD Last seen by Cards/Dr. Georgina Peer 10/2018- recommended f/u one year. She has hx of Atrial Fib and Anxiety/Stress. She reports increase in Anxiety/Stress r/t to the following stresses: Pandemic 4 close family members Corona Virus + in early Nov Daughter-in-law undergoing chemo-for which she provides all of the transportation to/from appt's. Mother passed in July- even though it was expected, she is still dealing with grief- agreeable to BH/psychology referral.  Patient Care Team    Relationship Specialty Notifications Start End  Esaw Grandchild, NP PCP - General Family Medicine  04/26/18   Jalene Mullet, MD Referring Physician Obstetrics and Gynecology  04/27/18     Patient Active Problem List   Diagnosis Date Noted  . Healthcare maintenance 04/27/2018  . Anxiety 04/27/2018  . Stress 04/27/2018  . Atrial fibrillation ([place]) 04/27/2018  . Hyperlipidemia 04/27/2018     Past Medical History:  Diagnosis Date  . Atrial fibrillation ([place])   . Hyperlipidemia      Past Surgical History:  Procedure Laterality Date  . BREAST SURGERY     biopsy and augmentation     Family History  Problem Relation Age of Onset  . Heart attack Mother   . Hypertension Mother   . Congestive Heart  Failure Mother   . Heart attack Father   . Diabetes Sister   . Diabetes Brother      Social History   Substance and Sexual Activity  Drug Use Never     Social History   Substance and Sexual Activity  Alcohol Use Yes  . Alcohol/week: 2.0 standard drinks  . Types: 2 Glasses of wine per week     Social History   Tobacco Use  Smoking Status Never Smoker  Smokeless Tobacco Never Used     Outpatient Encounter Medications as of 02/21/2019  Medication Sig  . ALPRAZolam (NIRAVAM) 0.25 MG dissolvable tablet Take 1 tablet (0.25 mg total) by mouth at bedtime as needed for anxiety.  Marland Kitchen atenolol (TENORMIN) 25 MG tablet Take 1 tablet by mouth daily.  Marland Kitchen DEXILANT 60 MG capsule Take 1 capsule by mouth daily.  . famotidine (PEPCID) 40 MG tablet Take 1 tablet by mouth daily.  . flecainide (TAMBOCOR) 100 MG tablet Take 1 tablet by mouth 2 (two) times daily.  . Lactobacillus Rhamnosus, GG, (CULTURELLE) CAPS Take 1 capsule by mouth daily.  . Multiple Vitamins-Minerals (MULTIVITAMIN ADULT PO) Take 1 tablet by mouth daily.  . rosuvastatin (CRESTOR) 5 MG tablet Take 1 tablet by mouth daily.  . valACYclovir (VALTREX) 500 MG tablet Take by mouth as needed.   No facility-administered encounter medications on file as of 02/21/2019.    Allergies: Patient has no allergy information on record.  Body mass index is 22.76  kg/m.  Blood pressure 135/72, pulse 67, temperature 98.6 F (37 C), temperature source Oral, height 5' 8.5" (1.74 m), weight 151 lb 14.4 oz (68.9 kg), SpO2 99 %.     Review of Systems  Constitutional: Positive for fatigue. Negative for activity change, appetite change, chills, diaphoresis, fever and unexpected weight change.  Eyes: Negative for visual disturbance.  Respiratory: Negative for cough, chest tightness, shortness of breath, wheezing and stridor.   Cardiovascular: Negative for chest pain, palpitations and leg swelling.  Neurological: Positive for headaches.  Negative for dizziness.  Hematological: Negative for adenopathy. Does not bruise/bleed easily.  Psychiatric/Behavioral: Positive for dysphoric mood. Negative for agitation, behavioral problems, confusion, decreased concentration, hallucinations, self-injury, sleep disturbance and suicidal ideas. The patient is nervous/anxious. The patient is not hyperactive.        Objective:   Physical Exam Vitals reviewed.  Constitutional:      General: She is not in acute distress.    Appearance: Normal appearance. She is normal weight. She is not ill-appearing, toxic-appearing or diaphoretic.  HENT:     Head: Normocephalic and atraumatic.  Eyes:     Extraocular Movements: Extraocular movements intact.     Conjunctiva/sclera: Conjunctivae normal.     Pupils: Pupils are equal, round, and reactive to light.  Cardiovascular:     Rate and Rhythm: Normal rate and regular rhythm.     Pulses: Normal pulses.     Heart sounds: Normal heart sounds. No murmur. No friction rub. No gallop.   Pulmonary:     Effort: Pulmonary effort is normal. No respiratory distress.     Breath sounds: Normal breath sounds. No stridor. No wheezing, rhonchi or rales.  Chest:     Chest wall: No tenderness.  Skin:    Capillary Refill: Capillary refill takes less than 2 seconds.  Neurological:     Mental Status: She is alert and oriented to person, place, and time.  Psychiatric:        Attention and Perception: Attention and perception normal.        Mood and Affect: Mood and affect normal.        Speech: Speech normal.        Behavior: Behavior normal.        Thought Content: Thought content normal.        Cognition and Memory: Cognition and memory normal.        Judgment: Judgment normal.     Comments: Well groomed. Tearful at times when speaking about various life stressors.            Assessment & Plan:   1. Anxiety   2. Stress   3. Healthcare maintenance   4. Hyperlipidemia, unspecified hyperlipidemia type    5. Atrial fibrillation, unspecified type [facility])     Healthcare maintenance Continue all medications as directed. Remain well hydrated, follow a heart healthy diet. Follow-up with your established Cardiologist for further evaluation/work-up. If you experience severe chest pain/chest pressure/chest heaviness/shortness of breath with significantly elevated blood pressure-seek immediate medical care. Referral to [facility] Health/Psychology placed, re: Anxiety. Continue to social distance and wear a mask when in public. Follow-up here as needed.  Anxiety Referral to [facility] Health/Psychology placed, re: Anxiety.  Hyperlipidemia Rosuvastatin 5mg  QD  Atrial fibrillation ([facility]) Atenolol 25mg  QD, Flecainide 100mg  QD Follow-up with your established Cardiologist for further evaluation/work-up.    FOLLOW-UP:  Return if symptoms worsen or fail to improve.

## 2019-02-21 NOTE — Patient Instructions (Signed)
Managing Your Hypertension Hypertension is commonly called high blood pressure. This is when the force of your blood pressing against the walls of your arteries is too strong. Arteries are blood vessels that carry blood from your heart throughout your body. Hypertension forces the heart to work harder to pump blood, and may cause the arteries to become narrow or stiff. Having untreated or uncontrolled hypertension can cause heart attack, stroke, kidney disease, and other problems. What are blood pressure readings? A blood pressure reading consists of a higher number over a lower number. Ideally, your blood pressure should be below 120/80. The first ("top") number is called the systolic pressure. It is a measure of the pressure in your arteries as your heart beats. The second ("bottom") number is called the diastolic pressure. It is a measure of the pressure in your arteries as the heart relaxes. What does my blood pressure reading mean? Blood pressure is classified into four stages. Based on your blood pressure reading, your health care provider may use the following stages to determine what type of treatment you need, if any. Systolic pressure and diastolic pressure are measured in a unit called mm Hg. Normal  Systolic pressure: below 120.  Diastolic pressure: below 80. Elevated  Systolic pressure: 120-129.  Diastolic pressure: below 80. Hypertension stage 1  Systolic pressure: 130-139.  Diastolic pressure: 80-89. Hypertension stage 2  Systolic pressure: 140 or above.  Diastolic pressure: 90 or above. What health risks are associated with hypertension? Managing your hypertension is an important responsibility. Uncontrolled hypertension can lead to:  A heart attack.  A stroke.  A weakened blood vessel (aneurysm).  Heart failure.  Kidney damage.  Eye damage.  Metabolic syndrome.  Memory and concentration problems. What changes can I make to manage my  hypertension? Hypertension can be managed by making lifestyle changes and possibly by taking medicines. Your health care provider will help you make a plan to bring your blood pressure within a normal range. Eating and drinking   Eat a diet that is high in fiber and potassium, and low in salt (sodium), added sugar, and fat. An example eating plan is called the DASH (Dietary Approaches to Stop Hypertension) diet. To eat this way: ? Eat plenty of fresh fruits and vegetables. Try to fill half of your plate at each meal with fruits and vegetables. ? Eat whole grains, such as whole wheat pasta, brown rice, or whole grain bread. Fill about one quarter of your plate with whole grains. ? Eat low-fat diary products. ? Avoid fatty cuts of meat, processed or cured meats, and poultry with skin. Fill about one quarter of your plate with lean proteins such as fish, chicken without skin, beans, eggs, and tofu. ? Avoid premade and processed foods. These tend to be higher in sodium, added sugar, and fat.  Reduce your daily sodium intake. Most people with hypertension should eat less than 1,500 mg of sodium a day.  Limit alcohol intake to no more than 1 drink a day for nonpregnant women and 2 drinks a day for men. One drink equals 12 oz of beer, 5 oz of wine, or 1 oz of hard liquor. Lifestyle  Work with your health care provider to maintain a healthy body weight, or to lose weight. Ask what an ideal weight is for you.  Get at least 30 minutes of exercise that causes your heart to beat faster (aerobic exercise) most days of the week. Activities may include walking, swimming, or biking.  Include exercise   to strengthen your muscles (resistance exercise), such as weight lifting, as part of your weekly exercise routine. Try to do these types of exercises for 30 minutes at least 3 days a week.  Do not use any products that contain nicotine or tobacco, such as cigarettes and e-cigarettes. If you need help quitting,  ask your health care provider.  Control any long-term (chronic) conditions you have, such as high cholesterol or diabetes. Monitoring  Monitor your blood pressure at home as told by your health care provider. Your personal target blood pressure may vary depending on your medical conditions, your age, and other factors.  Have your blood pressure checked regularly, as often as told by your health care provider. Working with your health care provider  Review all the medicines you take with your health care provider because there may be side effects or interactions.  Talk with your health care provider about your diet, exercise habits, and other lifestyle factors that may be contributing to hypertension.  Visit your health care provider regularly. Your health care provider can help you create and adjust your plan for managing hypertension. Will I need medicine to control my blood pressure? Your health care provider may prescribe medicine if lifestyle changes are not enough to get your blood pressure under control, and if:  Your systolic blood pressure is 130 or higher.  Your diastolic blood pressure is 80 or higher. Take medicines only as told by your health care provider. Follow the directions carefully. Blood pressure medicines must be taken as prescribed. The medicine does not work as well when you skip doses. Skipping doses also puts you at risk for problems. Contact a health care provider if:  You think you are having a reaction to medicines you have taken.  You have repeated (recurrent) headaches.  You feel dizzy.  You have swelling in your ankles.  You have trouble with your vision. Get help right away if:  You develop a severe headache or confusion.  You have unusual weakness or numbness, or you feel faint.  You have severe pain in your chest or abdomen.  You vomit repeatedly.  You have trouble breathing. Summary  Hypertension is when the force of blood pumping  through your arteries is too strong. If this condition is not controlled, it may put you at risk for serious complications.  Your personal target blood pressure may vary depending on your medical conditions, your age, and other factors. For most people, a normal blood pressure is less than 120/80.  Hypertension is managed by lifestyle changes, medicines, or both. Lifestyle changes include weight loss, eating a healthy, low-sodium diet, exercising more, and limiting alcohol. This information is not intended to replace advice given to you by your health care provider. Make sure you discuss any questions you have with your health care provider. Document Released: 11/19/2011 Document Revised: 06/18/2018 Document Reviewed: 01/23/2016 Elsevier Patient Education  2020 [employer]   Generalized Anxiety Disorder, Adult Generalized anxiety disorder (GAD) is a mental health disorder. People with this condition constantly worry about everyday events. Unlike normal anxiety, worry related to GAD is not triggered by a specific event. These worries also do not fade or get better with time. GAD interferes with life functions, including relationships, work, and school. GAD can vary from mild to severe. People with severe GAD can have intense waves of anxiety with physical symptoms (panic attacks). What are the causes? The exact cause of GAD is not known. What increases the risk? This condition is  more likely to develop in:  Women.  People who have a family history of anxiety disorders.  People who are very shy.  People who experience very stressful life events, such as the death of a loved one.  People who have a very stressful family environment. What are the signs or symptoms? People with GAD often worry excessively about many things in their lives, such as their health and family. They may also be overly concerned about:  Doing well at work.  Being on time.  Natural  disasters.  Friendships. Physical symptoms of GAD include:  Fatigue.  Muscle tension or having muscle twitches.  Trembling or feeling shaky.  Being easily startled.  Feeling like your heart is pounding or racing.  Feeling out of breath or like you cannot take a deep breath.  Having trouble falling asleep or staying asleep.  Sweating.  Nausea, diarrhea, or irritable bowel syndrome (IBS).  Headaches.  Trouble concentrating or remembering facts.  Restlessness.  Irritability. How is this diagnosed? Your health care provider can diagnose GAD based on your symptoms and medical history. You will also have a physical exam. The health care provider will ask specific questions about your symptoms, including how severe they are, when they started, and if they come and go. Your health care provider may ask you about your use of alcohol or drugs, including prescription medicines. Your health care provider may refer you to a mental health specialist for further evaluation. Your health care provider will do a thorough examination and may perform additional tests to rule out other possible causes of your symptoms. To be diagnosed with GAD, a person must have anxiety that:  Is out of his or her control.  Affects several different aspects of his or her life, such as work and relationships.  Causes distress that makes him or her unable to take part in normal activities.  Includes at least three physical symptoms of GAD, such as restlessness, fatigue, trouble concentrating, irritability, muscle tension, or sleep problems. Before your health care provider can confirm a diagnosis of GAD, these symptoms must be present more days than they are not, and they must last for six months or longer. How is this treated? The following therapies are usually used to treat GAD:  Medicine. Antidepressant medicine is usually prescribed for long-term daily control. Antianxiety medicines may be added in  severe cases, especially when panic attacks occur.  Talk therapy (psychotherapy). Certain types of talk therapy can be helpful in treating GAD by providing support, education, and guidance. Options include: ? Cognitive behavioral therapy (CBT). People learn coping skills and techniques to ease their anxiety. They learn to identify unrealistic or negative thoughts and behaviors and to replace them with positive ones. ? Acceptance and commitment therapy (ACT). This treatment teaches people how to be mindful as a way to cope with unwanted thoughts and feelings. ? Biofeedback. This process trains you to manage your body's response (physiological response) through breathing techniques and relaxation methods. You will work with a therapist while machines are used to monitor your physical symptoms.  Stress management techniques. These include yoga, meditation, and exercise. A mental health specialist can help determine which treatment is best for you. Some people see improvement with one type of therapy. However, other people require a combination of therapies. Follow these instructions at home:  Take over-the-counter and prescription medicines only as told by your health care provider.  Try to maintain a normal routine.  Try to anticipate stressful situations and allow  extra time to manage them.  Practice any stress management or self-calming techniques as taught by your health care provider.  Do not punish yourself for setbacks or for not making progress.  Try to recognize your accomplishments, even if they are small.  Keep all follow-up visits as told by your health care provider. This is important. Contact a health care provider if:  Your symptoms do not get better.  Your symptoms get worse.  You have signs of depression, such as: ? A persistently sad, cranky, or irritable mood. ? Loss of enjoyment in activities that used to bring you joy. ? Change in weight or eating. ? Changes in  sleeping habits. ? Avoiding friends or family members. ? Loss of energy for normal tasks. ? Feelings of guilt or worthlessness. Get help right away if:  You have serious thoughts about hurting yourself or others. If you ever feel like you may hurt yourself or others, or have thoughts about taking your own life, get help right away. You can go to your nearest emergency department or call:  Your local emergency services (911 in the [place]  A suicide crisis helpline, such as the [facility] at 865-039-5997. This is open 24 hours a day. Summary  Generalized anxiety disorder (GAD) is a mental health disorder that involves worry that is not triggered by a specific event.  People with GAD often worry excessively about many things in their lives, such as their health and family.  GAD may cause physical symptoms such as restlessness, trouble concentrating, sleep problems, frequent sweating, nausea, diarrhea, headaches, and trembling or muscle twitching.  A mental health specialist can help determine which treatment is best for you. Some people see improvement with one type of therapy. However, other people require a combination of therapies. This information is not intended to replace advice given to you by your health care provider. Make sure you discuss any questions you have with your health care provider. Document Released: 06/21/2012 Document Revised: 02/06/2017 Document Reviewed: 01/15/2016 Elsevier Patient Education  2020 [place] all medications as directed. Remain well hydrated, follow a heart healthy diet. Follow-up with your established Cardiologist for further evaluation/work-up. If you experience severe chest pain/chest pressure/chest heaviness/shortness of breath with significantly elevated blood pressure-seek immediate medical care. Referral to [facility] placed, re: Anxiety. Continue to social distance and wear a mask  when in public. Follow-up here as needed.

## 2019-03-14 ENCOUNTER — Other Ambulatory Visit: Payer: Self-pay | Admitting: Adult Health

## 2019-03-14 NOTE — Telephone Encounter (Signed)
FINAL REFILL

## 2019-03-16 ENCOUNTER — Ambulatory Visit (INDEPENDENT_AMBULATORY_CARE_PROVIDER_SITE_OTHER): Payer: 59 | Admitting: Adult Health

## 2019-03-16 ENCOUNTER — Other Ambulatory Visit: Payer: Self-pay

## 2019-03-16 DIAGNOSIS — Z23 Encounter for immunization: Secondary | ICD-10-CM | POA: Diagnosis not present

## 2019-03-16 NOTE — Progress Notes (Addendum)
Patient presents today for 2nd shingrix vaccine. Patient is well with no fever or sickness. Vaccine given in R deltoid and tolerated well. VIS given. AS, CMA

## 2019-04-20 ENCOUNTER — Ambulatory Visit: Payer: 59 | Admitting: Adult Health

## 2019-04-23 ENCOUNTER — Ambulatory Visit: Payer: 59 | Attending: Internal Medicine

## 2019-04-23 DIAGNOSIS — Z23 Encounter for immunization: Secondary | ICD-10-CM

## 2019-04-23 NOTE — Progress Notes (Signed)
   Cov[facility] Vaccination [facility]  Name:  Deborah Farmer    MRN: [id number] DOB: [birth year]  04/23/2019  [name] was observed post Covid-19 immunization for 15 minutes without incidence. She was provided with Vaccine Information Sheet and instruction to access the V-Safe system.   Ms. Manner was instructed to call 911 with any severe reactions post vaccine: Marland Kitchen Difficulty breathing  . Swelling of your face and throat  . A fast heartbeat  . A bad rash all over your body  . Dizziness and weakness    Immunizations Administered    Name Date Dose VIS Date Route   Pfizer COVID-19 Vaccine 04/23/2019  8:30 AM 0.3 mL 02/18/2019 Intramuscular   Manufacturer: [employer], [employer]   Lot: [id number]   NDC: [address]-1

## 2019-04-26 ENCOUNTER — Ambulatory Visit (INDEPENDENT_AMBULATORY_CARE_PROVIDER_SITE_OTHER): Payer: 59 | Admitting: Family Medicine

## 2019-04-26 ENCOUNTER — Encounter: Payer: Self-pay | Admitting: Family Medicine

## 2019-04-26 ENCOUNTER — Other Ambulatory Visit: Payer: Self-pay

## 2019-04-26 VITALS — BP 130/88 | Ht 69.0 in | Wt 152.0 lb

## 2019-04-26 DIAGNOSIS — F439 Reaction to severe stress, unspecified: Secondary | ICD-10-CM | POA: Diagnosis not present

## 2019-04-26 DIAGNOSIS — E785 Hyperlipidemia, unspecified: Secondary | ICD-10-CM

## 2019-04-26 DIAGNOSIS — I4891 Unspecified atrial fibrillation: Secondary | ICD-10-CM

## 2019-04-26 DIAGNOSIS — I10 Essential (primary) hypertension: Secondary | ICD-10-CM | POA: Insufficient documentation

## 2019-04-26 DIAGNOSIS — F411 Generalized anxiety disorder: Secondary | ICD-10-CM | POA: Diagnosis not present

## 2019-04-26 DIAGNOSIS — B002 Herpesviral gingivostomatitis and pharyngotonsillitis: Secondary | ICD-10-CM | POA: Diagnosis not present

## 2019-04-26 DIAGNOSIS — G4700 Insomnia, unspecified: Secondary | ICD-10-CM | POA: Insufficient documentation

## 2019-04-26 HISTORY — DX: Herpesviral gingivostomatitis and pharyngotonsillitis: B00.2

## 2019-04-26 MED ORDER — ROSUVASTATIN CALCIUM 10 MG PO TABS: 10.0000 mg | ORAL_TABLET | Freq: Every day | ORAL | 0 refills | Status: DC

## 2019-04-26 MED ORDER — MELATONIN 3-10 MG PO TABS: ORAL_TABLET | ORAL | 0 refills | Status: DC

## 2019-04-26 MED ORDER — ESCITALOPRAM OXALATE 10 MG PO TABS: 10.0000 mg | ORAL_TABLET | Freq: Every day | ORAL | 0 refills | Status: DC

## 2019-04-26 MED ORDER — VALACYCLOVIR HCL 1 G PO TABS: 1000.0000 mg | ORAL_TABLET | Freq: Two times a day (BID) | ORAL | 1 refills | Status: AC

## 2019-04-26 NOTE — Progress Notes (Signed)
Telehealth office visit note for Deborah Farmer, D.O- at [facility] at [facility]   I connected with current patient today and verified that I am speaking with the correct person using two identifiers.   . Location of the patient: Home . Location of the provider: Office Only the patient (+/- their family members at pt's discretion) and myself were participating in the encounter - This visit type was conducted due to national recommendations for restrictions regarding the COVID-19 Pandemic (e.g. social distancing) in an effort to limit this patient's exposure and mitigate transmission in our community.  This format is felt to be most appropriate for this patient at this time.   - No physical exam could be performed with this format, beyond that communicated to [place] by the patient/ family members as noted.   - Additionally my office staff/ schedulers discussed with the patient that there may be a monetary charge related to this service, depending on their medical insurance.   The patient expressed understanding, and agreed to proceed.       History of Present Illness: Hypertension    I, Deborah Farmer, am serving as scribe for Dr.Jaysten Essner.  - Anxiety, managed on Alprazolam and other meds in the past Patient confirms that she's been managed on alprazolam, and receives medications from her other specialist providers.    States "I've been feeling good recently; trying to drink more water, trying to eliminate stress."    She deals with anxiety, which is why she received the alprazolam.  She has taken it for years for anxiety, and has taken an antidepressant in the past.  Notes "I haven't taken anything in 6-7 years" in terms of antidepressants.   Her anxiety has improved from time to time, and notes "I myself got off of the xanax / alprazolam (in the past) and have been off of it close to two years, and then my mom passed away in Sep 27, 2022 of last year."  Notes she did not pass from  [place]; patient's mother was age 27 with congestive heart failure.  She and her sister and brother were her caregivers for several months.  In addition, her daughter in law was diagnosed with breast cancer in the meantime.  Patient notes she is somewhat her DIL's caregiver too, helping take her to appointments, chemo, and radiation.  A lot of the family has chipped in for this care.  Patient believes "after my mom passed, [the anxiety] just hit me, and COVID got worse, and I've had ten family members that have had it, and just my own fear of getting it and knowing they've had the experience, I think it just threw me and all of it together hit me really hard."  States she didn't want to resume alprazolam because she felt so much better off of it.  Was taking 0.5 mg for a long time in the past and sometimes as-needed for sleep; states she needed to use it for sleep at night while on antidepressants.  She is currently taking alprazolam for panic and sleep.  In the last month, while she has been able to fall asleep initially, she keeps waking up at 2-3 AM.  "When I would wake up, I would be shivering [with anxiety]; it was like I was having skin crawling and weird feelings and couldn't go back to sleep."   - Acute Panic Regarding acute panic, she does occasionally get a little uptight and feels she needs something to take away  her stress and worry.  During these events, she experiences palpitations, feeling like she's short of breath.  To help these, she engages in deep breathing, and is currently trying to stay better hydrated.  Believes she experiences this overwhelming sensation at least once per week.  Has only been managed on Xanax recently.  Took Prozac for four years, many years ago after her father's death.  Notes "I will never take that medicine again."  Says "I just have really bad feelings about how for four years, I was not myself."  Says she had that "zombie effect" and "I thought it was  supposed to be better, but I felt like I was worse."  Was switched to Wellbutrin at some point in the past, and notes "had a lot more energy with that and seemed to come back around, I think."  She worries the Wellbutrin may have triggered her racing heart, and eventually quit taking this.  She took Lexapro for four months in the past, and notes "it was okay."  - Insomnia Currently she takes a half tablet of alprazolam at 0.25 mg.  She has been taking this nightly for the last five days.  "I resisted it, and didn't want to start back on it."  She wanted to try melatonin to help instead but never did.  Notes she decided to take a half tablet of alprazolam as a sleep aid, "and that seemed to hold me okay for the nighttime, and it does make me feel better."  Says on the alprazolam, she feels more relaxed and restful in her sleep, and if she rouses, she doesn't feel as tight and tense and jittery.  Notes she saw her GYN last week for her routine pap smear was talking to her about her anxiety / sleep, and was suggested to take a different medication for sleep because it's specifically for anxiety.  She cannot remember the name of this medication.  Says "I'm very fearful about trying new drugs because of my afib."  - Atrial Fibrillation She follows up with Dr. Johnanna Schneiders via [facility].  Notes she has afib, which is controlled with meds prescribed by specialist.  She has PVC's as well, and sometimes experiences breakthrough with these.  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  Notes "just the cold sores."  Says she has "had two [outbreaks] over the past two months, which I know may be stress related."  To treat, she took some of her expired Valtrex, and they did help, and just wondered if she could obtain a fresh prescription.  - Breast  concerns contributing to anxiety  Notes she has a diagnostic mammogram coming up.  She currently has a painful left breast with some sensations and burning and warmth.  Says that this adds to her inability to sleep and everything.  HPI:  Hyperlipidemia:  64 y.o. female here for cholesterol follow-up.   - Patient reports good compliance with treatment plan of:  medication and/ or lifestyle management.    She takes 5 mg of Crestor nightly before bed.  - Patient denies any acute concerns or problems with management plan   - She denies new onset of: myalgias, arthralgias, increased fatigue more than normal, chest pains, exercise intolerance, shortness of breath, dizziness, visual changes, headache, lower extremity swelling or claudication.   Most  recent cholesterol panel was:  Lab Results  Component Value Date   CHOL 165 10/14/2018   HDL 49 10/14/2018   LDLCALC 86 10/14/2018   TRIG 152 (H) 10/14/2018   CHOLHDL 3.4 10/14/2018   Hepatic Function Latest Ref Rng & Units 10/14/2018  Total Protein 6.0 - 8.5 g/dL 6.8  Albumin 3.8 - 4.8 g/dL 4.6  AST 0 - 40 IU/L 35  ALT 0 - 32 IU/L 29  Alk Phosphatase 39 - 117 IU/L 58  Total Bilirubin 0.0 - 1.2 mg/dL 0.6      No flowsheet data found.  Depression screen [facility] 2/9 04/26/2019 02/21/2019 10/18/2018 04/27/2018  Decreased Interest 0 0 0 0  Down, Depressed, Hopeless 0 1 0 1  PHQ - 2 Score 0 1 0 1  Altered sleeping '2 3 1 2  ' Tired, decreased energy 0 0 0 0  Change in appetite 0 0 0 1  Feeling bad or failure about yourself  0 0 0 0  Trouble concentrating 0 0 0 1  Moving slowly or fidgety/restless 0 0 0 0  Suicidal thoughts 0 0 0 0  PHQ-9 Score '2 4 1 5  ' Difficult doing work/chores Not difficult at all Somewhat difficult Not difficult at all Somewhat difficult      Impression and Recommendations:    1. GAD (generalized anxiety disorder)   2. Stress- DIL breast CA and elderly mother died in 05-24-18. Insomnia, unspecified type   4. Oral  herpes simplex infection   5. Hyperlipidemia, unspecified hyperlipidemia type   6. Atrial fibrillation, unspecified type (HCC)       GAD, Stress - DIL breast CA and elderly mother died in 05-24-18 - Discussed symptoms of anxiety with patient during appointment today. - Extensive education provided and all questions answered.  - Discussed appropriate uses of alprazolam for instances of acute panic only.  RFed last on 1/21.  Pt understands she should try to only 2/mo max and since it is more than that, I recommend we start pt on daily SSRI. - Reviewed that patient should be using her xanax around twice per month.   - Explained need to evaluate options for chronic control of anxiety to bring patient's acute instances of panic down from once or twice per week.  - Discussed beginning low-dose Lexapro as an option for chronic anxiety management.  -  Per patient, has not gone to psychology as referred prior.  Per patient, had planned to reach out through her insurance company, because she has ten visits covered, and wished to try this avenue first.  Patient agrees to call clinic for additional assistance / referral if this does not work out.  - In addition to prescription intervention, reviewed the "spokes of the wheel" of mood and health management.  Stressed the importance of ongoing prudent habits, including therapy/counseling, regular exercise, appropriate sleep hygiene, healthful dietary habits, and prayer/meditation to relax.  - Will continue to monitor and re-check patient's progress on mood medication in 2 months.   Insomnia - Reviewed symptoms of insomnia with patient during appointment today, including sleeplessness caused by anxiety.  Discussed critical need to control patient's underlying chronic anxiety to help improve sleep.  - Discussed need to look at options for sleep aid that are safer than chronic xanax use. - Encouraged beginning melatonin OTC to start each night. - Reviewed  that long-acting and short-acting options are available to help the patient get to sleep and stay asleep.  - Discontinue Xanax for  sleep aid.  Patient understands this is for panic attack only and not meant for daily long-term use at all  - Begin OTC melatonin as advised.  See med list.  - Will continue to monitor and re-check patient's progress in 2 months.   Atrial Fibrillation - Well controlled, stable at this time. - Managed by [facility] Georgina Peer of [facility]. - Continue treatment plan as established.  - Patient will continue to obtain cardiac meds and management from Dr. Georgina Peer.  - Will continue to monitor alongside specialist.   Hyperlipidemia - Discussed need to control patient's LDL more carefully given patient's history of afib. - Suboptimally controlled based on last labs drawn in 8 of 2020.  The 10-year ASCVD risk score Mikey Bussing DC Jr., et al., 2013) is: 5.9%  - Recommended increasing dose of Crestor today.  - Begin 10 mg nightly before bed.  See med list.  - Encouraged prudent dietary changes such as low saturated & trans fat diets for hyperlipidemia and low carb diets for hypertriglyceridemia.  - Encouraged patient to follow AHA guidelines for regular exercise.  - We will continue to monitor and re-check FLP and ALT in 2-3 months.   Oral Herpes Simplex Infection - Managed on Valtrex - Per patient, two outbreaks recently likely due to increased stress.  - Prescription Valtrex provided for acute treatment of outbreaks; one tablet, twice daily for three days.  - Advised patient of need to consider chronic suppressant therapy if she begins to have over 3 outbreaks per year.  - Recommended that if another outbreak occurs, patient should let the clinic know so that her therapy can be adjusted.  - Patient understands critical importance of controlling her anxiety to help prevent further outbreaks.  - Will continue to monitor.   Health Counseling & Preventative  Maintenance - Advised patient to continue working toward exercising to improve overall mental, physical, and emotional health.    - Encouraged patient to engage in daily physical activity, especially a formal exercise routine.  Recommended that the patient eventually strive for at least 150 minutes of moderate cardiovascular activity per week according to guidelines established by the [facility]   - Healthy dietary habits encouraged, including low-carb, and high amounts of lean protein in diet.   - Patient should also consume adequate amounts of water.  - Health counseling performed.  All questions answered.   Recommendations - Re-check FLP and ALT in 2-3 months with OV 3-5 days later. - Follow up in 2-3 months for progress on Lexapro, sleep management.   - As part of my medical decision making, I reviewed the following data within the [place] History obtained from pt /family, CMA notes reviewed and incorporated if applicable, Labs reviewed, Radiograph/ tests reviewed if applicable and OV notes from prior OV's with me, as well as other specialists she/he has seen since seeing me last, were all reviewed and used in my medical decision making process today.    - Additionally, discussion had with patient regarding our treatment plan, and their biases/concerns about that plan were used in my medical decision making today.    - The patient agreed with the plan and demonstrated an understanding of the instructions.   No barriers to understanding were identified.     Return we increased dose crestor today., for FLP, ALT in 2-3 mo with DOXY OV 3-5 days later to review cholesterol, mood, sleep.    Orders Placed This Encounter  Procedures  . ALT  . Lipid  panel    Meds ordered this encounter  Medications  . valACYclovir (VALTREX) 1000 MG tablet    Sig: Take 1 tablet (1,000 mg total) by mouth 2 (two) times daily for 3 days.    Dispense:  6 tablet    Refill:  1  . escitalopram  (LEXAPRO) 10 MG tablet    Sig: Take 1 tablet (10 mg total) by mouth daily.    Dispense:  90 tablet    Refill:  0  . Melatonin 3-10 MG TABS    Sig: 3-1m    Dispense:       Refill:  0  . rosuvastatin (CRESTOR) 10 MG tablet    Sig: Take 1 tablet (10 mg total) by mouth at bedtime.    Dispense:  90 tablet    Refill:  0    Medications Discontinued During This Encounter  Medication Reason  . famotidine (PEPCID) 40 MG tablet Error  . valACYclovir (VALTREX) 500 MG tablet Reorder  . rosuvastatin (CRESTOR) 5 MG tablet Reorder      I provided 28+ minutes of non face-to-face time during this encounter.  Additional time was spent with charting and coordination of care before and after the actual visit commenced.   Note:  This note was prepared with assistance of Dragon voice recognition software. Occasional wrong-word or sound-a-like substitutions may have occurred due to the inherent limitations of voice recognition software.   This document serves as a record of services personally performed by DMellody Dance DO. It was created on her behalf by KToni Farmer a trained medical scribe. The creation of this record is based on the scribe's personal observations and the provider's statements to them.   This case required medical decision making of at least moderate complexity.  This case required medical decision making of at least moderate complexity. The above documentation from KToni Farmer medical scribe, has been reviewed by DMarjory Sneddon D.O.       Patient Care Team    Relationship Specialty Notifications Start End  [place] KValetta FullerD, NP PCP - General Family Medicine  04/26/18   AJalene Mullet MD Referring Physician Obstetrics and Gynecology  04/27/18      -Vitals obtained; medications/ allergies reconciled;  personal medical, social, Sx etc.histories were updated by CMA, reviewed by me and are reflected in chart   Patient Active Problem List   Diagnosis  Date Noted  . HTN (hypertension) 04/26/2019  . Hyperlipidemia 04/27/2018  . PAF (paroxysmal atrial fibrillation) ([place] 10/14/2013  . Anxiety 04/27/2018  . Stress 04/27/2018  . Insomnia 04/26/2019  . 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 Healthcare maintenance 04/27/2018  . Lumbar radicular pain 12/24/2015  . Beta-blocker intolerance 08/22/2014     Current Meds  Medication Sig  . ALPRAZolam (NIRAVAM) 0.25 MG dissolvable tablet Take 1 tablet (0.25 mg total) by mouth at bedtime as needed for anxiety. NO ADDITIONAL REFILLS  . atenolol (TENORMIN) 25 MG tablet Take 1 tablet by mouth daily.  .Marland KitchenDEXILANT 60 MG capsule Take 1 capsule by mouth daily.  . flecainide (TAMBOCOR) 100 MG tablet Take 1 tablet by mouth 2 (two) times daily.  . Lactobacillus Rhamnosus, GG, (CULTURELLE) CAPS Take 1 capsule by mouth daily.  . Multiple Vitamins-Minerals (MULTIVITAMIN ADULT PO) Take 1 tablet by mouth daily.  . rosuvastatin (CRESTOR) 10 MG tablet Take 1 tablet (10 mg total) by mouth at bedtime.  . valACYclovir (VALTREX) 1000 MG tablet Take 1 tablet (1,000 mg total) by mouth 2 (two) times daily  for 3 days.  . [DISCONTINUED] rosuvastatin (CRESTOR) 5 MG tablet Take 1 tablet by mouth daily.  . [DISCONTINUED] valACYclovir (VALTREX) 500 MG tablet Take by mouth as needed.     Allergies:  No Known Allergies   ROS:  See above HPI for pertinent positives and negatives   Objective:   Blood pressure 130/88, height '5\' 9"'  (1.753 m), weight 152 lb (68.9 kg).  (if some vitals are omitted, this means that patient was UNABLE to obtain them even though they were asked to get them prior to OV today.  They were asked to call us at their earliest convenience with these once obtained. )  General: A & O * 3; sounds in no acute distress; in usual state of health.  Skin: Pt confirms warm and dry extremities and pink fingertips HEENT: Pt confirms lips non-cyanotic Chest: Patient confirms normal chest excursion and  movement Respiratory: speaking in full sentences, no conversational dyspnea; patient confirms no use of accessory muscles Psych: insight appears good, mood- appears full

## 2019-05-17 ENCOUNTER — Ambulatory Visit: Payer: 59 | Attending: Internal Medicine

## 2019-05-17 DIAGNOSIS — Z23 Encounter for immunization: Secondary | ICD-10-CM

## 2019-05-17 NOTE — Progress Notes (Signed)
   Covid-19 Vaccination Clinic  Name:  Deborah Farmer    MRN: [id number] DOB: [birth year]  05/17/2019  [name] was observed post Covid-19 immunization for 15 minutes without incident. She was provided with Vaccine Information Sheet and instruction to access the V-Safe system.   [name] was instructed to call 911 with any severe reactions post vaccine: Marland Kitchen Difficulty breathing  . Swelling of face and throat  . A fast heartbeat  . A bad rash all over body  . Dizziness and weakness   Immunizations Administered    Name Date Dose VIS Date Route   Pfizer COVID-19 Vaccine 05/17/2019  1:05 PM 0.3 mL 02/18/2019 Intramuscular   Manufacturer: [employer], [employer]   Lot: [id number]   NDC: [address]-1

## 2019-07-03 ENCOUNTER — Encounter: Payer: Self-pay | Admitting: Family Medicine

## 2019-07-04 ENCOUNTER — Other Ambulatory Visit: Payer: Self-pay | Admitting: Family Medicine

## 2019-07-04 DIAGNOSIS — E785 Hyperlipidemia, unspecified: Secondary | ICD-10-CM

## 2019-07-04 MED ORDER — ROSUVASTATIN CALCIUM 10 MG PO TABS: ORAL_TABLET | ORAL | 0 refills | Status: DC

## 2019-08-12 ENCOUNTER — Encounter: Payer: Self-pay | Admitting: Physician Assistant

## 2019-08-12 ENCOUNTER — Ambulatory Visit: Payer: 59 | Admitting: Physician Assistant

## 2019-08-12 ENCOUNTER — Other Ambulatory Visit: Payer: Self-pay

## 2019-08-12 VITALS — BP 120/77 | HR 70 | Temp 97.6°F | Ht 68.5 in | Wt 148.9 lb

## 2019-08-12 DIAGNOSIS — M62838 Other muscle spasm: Secondary | ICD-10-CM

## 2019-08-12 MED ORDER — METHOCARBAMOL 750 MG PO TABS: 750.0000 mg | ORAL_TABLET | Freq: Three times a day (TID) | ORAL | 0 refills | Status: DC | PRN

## 2019-08-12 NOTE — Progress Notes (Signed)
Acute Office Visit  Subjective:    Patient ID: Deborah Farmer, female    DOB: 06-23-1955, 64 y.o.   MRN: [id number]  Chief Complaint  Patient presents with  . Back Pain    HPI Patient is in today for a flare-up of back spasms and would like a refill of Methocarbamol. Pt reports she feels tightening in her lower back, mostly localized on left side and gets worse with sitting. She had an old prescription of Methocarbamol and took a few tablets which provided some relief. She does have a history of back spasms. Reports she went to the beach a few weeks ago and believes the long car ride triggered her flare-up. Denies bladder or bowel dysfunction.  Past Medical History:  Diagnosis Date  . Atrial fibrillation ([place])   . Hyperlipidemia     Past Surgical History:  Procedure Laterality Date  . BREAST SURGERY     biopsy and augmentation    Family History  Problem Relation Age of Onset  . Heart attack Mother   . Hypertension Mother   . Congestive Heart Failure Mother   . Heart attack Father   . Diabetes Sister   . Diabetes Brother     Social History   Socioeconomic History  . Marital status: Married    Spouse name: Not on file  . Number of children: Not on file  . Years of education: Not on file  . Highest education level: Not on file  Occupational History  . Not on file  Tobacco Use  . Smoking status: Never Smoker  . Smokeless tobacco: Never Used  Vaping Use  . Vaping Use: Never used  Substance and Sexual Activity  . Alcohol use: Yes    Alcohol/week: 2.0 standard drinks    Types: 2 Glasses of wine per week  . Drug use: Never  . Sexual activity: Yes    Birth control/protection: None  Other Topics Concern  . Not on file  Social History Narrative  . Not on file   Social Determinants of Health   Financial Resource Strain:   . Difficulty of Paying Living Expenses:   Food Insecurity:   . Worried About Charity fundraiser in the Last Year:   . Arboriculturist in  the Last Year:   Transportation Needs:   . Film/video editor (Medical):   Marland Kitchen Lack of Transportation (Non-Medical):   Physical Activity:   . Days of Exercise per Week:   . Minutes of Exercise per Session:   Stress:   . Feeling of Stress :   Social Connections:   . Frequency of Communication with Friends and Family:   . Frequency of Social Gatherings with Friends and Family:   . Attends Religious Services:   . Active Member of Clubs or Organizations:   . Attends Archivist Meetings:   Marland Kitchen Marital Status:   Intimate Partner Violence:   . Fear of Current or Ex-Partner:   . Emotionally Abused:   Marland Kitchen Physically Abused:   . Sexually Abused:     Outpatient Medications Prior to Visit  Medication Sig Dispense Refill  . ALPRAZolam (NIRAVAM) 0.25 MG dissolvable tablet Take 1 tablet (0.25 mg total) by mouth at bedtime as needed for anxiety. NO ADDITIONAL REFILLS 15 tablet 0  . atenolol (TENORMIN) 25 MG tablet Take 1 tablet by mouth daily.    Marland Kitchen DEXILANT 60 MG capsule Take 1 capsule by mouth daily.    Marland Kitchen escitalopram (LEXAPRO) 10 MG  tablet Take 1 tablet (10 mg total) by mouth daily. (Patient not taking: Reported on 08/12/2019) 90 tablet 0  . flecainide (TAMBOCOR) 100 MG tablet Take 1 tablet by mouth 2 (two) times daily.    . Lactobacillus Rhamnosus, GG, (CULTURELLE) CAPS Take 1 capsule by mouth daily.    . Melatonin 3-10 MG TABS 3-10mg  (Patient not taking: Reported on 08/12/2019)  0  . Multiple Vitamins-Minerals (MULTIVITAMIN ADULT PO) Take 1 tablet by mouth daily.    . rosuvastatin (CRESTOR) 10 MG tablet 0.5 tab every other night before bed (Patient not taking: Reported on 08/12/2019) 45 tablet 0   No facility-administered medications prior to visit.    No Known Allergies  Review of Systems Review of Systems:  A fourteen system review of systems was performed and found to be positive as per HPI.      Objective:    Physical Exam General: Pleasant and well nourished, in no  apparent distress. Eyes: PERRLA, EOMs, conjunctiva clr Resp: Respiratory effort- normal, ECTA B/L w/o W/R/R  Cardio: RRR w/o MRGs. Abdomen: no gross distention. Lymphatics:  less 2 sec cap RF M-sk: Good ROM, normal gait. Tenderness to palpation of low back with some muscular contraction. No bony abnormalities present. Skin: Warm, dry  Neuro: Alert, Oriented Psych: Normal affect, Insight and Judgment appropriate.   BP 120/77   Pulse 70   Temp 97.6 F (36.4 C) (Oral)   Ht 5' 8.5" (1.74 m)   Wt 148 lb 14.4 oz (67.5 kg)   SpO2 100%   BMI 22.31 kg/m  Wt Readings from Last 3 Encounters:  08/25/19 147 lb (66.7 kg)  08/12/19 148 lb 14.4 oz (67.5 kg)  04/26/19 152 lb (68.9 kg)    There are no preventive care reminders to display for this patient.  There are no preventive care reminders to display for this patient.   Lab Results  Component Value Date   TSH 0.918 10/14/2018   Lab Results  Component Value Date   WBC 4.2 10/14/2018   HGB 12.2 10/14/2018   HCT 36.6 10/14/2018   MCV 88 10/14/2018   PLT 193 10/14/2018   Lab Results  Component Value Date   NA 142 10/14/2018   K 4.6 10/14/2018   CO2 27 10/14/2018   GLUCOSE 94 10/14/2018   BUN 18 10/14/2018   CREATININE 0.82 10/14/2018   BILITOT 0.6 10/14/2018   ALKPHOS 58 10/14/2018   AST 35 10/14/2018   ALT 30 08/23/2019   PROT 6.8 10/14/2018   ALBUMIN 4.6 10/14/2018   CALCIUM 9.6 10/14/2018   Lab Results  Component Value Date   CHOL 233 (H) 08/23/2019   Lab Results  Component Value Date   HDL 51 08/23/2019   Lab Results  Component Value Date   LDLCALC 159 (H) 08/23/2019   Lab Results  Component Value Date   TRIG 128 08/23/2019   Lab Results  Component Value Date   CHOLHDL 4.6 (H) 08/23/2019   Lab Results  Component Value Date   HGBA1C 5.2 10/14/2018       Assessment & Plan:   Problem List Items Addressed This Visit    None    Visit Diagnoses    Muscle spasm    -  Primary   Relevant  Medications   methocarbamol (ROBAXIN) 750 MG tablet     Muscle Spasm: - Pt has cardiac history of PAF and tolerated Methocarbamol without issues in the past so provided refill.  - Advise to avoid provocative activities.  -  Follow-up if symptoms fail to improve or worsen.   Meds ordered this encounter  Medications  . methocarbamol (ROBAXIN) 750 MG tablet    Sig: Take 1 tablet (750 mg total) by mouth 3 (three) times daily as needed for muscle spasms.    Dispense:  90 tablet    Refill:  0    Order Specific Question:   Supervising Provider    Answer:   Nani Gasser D [[id number]]     Mayer Masker, PA-C

## 2019-08-22 ENCOUNTER — Other Ambulatory Visit: Payer: Self-pay | Admitting: Physician Assistant

## 2019-08-22 DIAGNOSIS — E785 Hyperlipidemia, unspecified: Secondary | ICD-10-CM

## 2019-08-23 ENCOUNTER — Other Ambulatory Visit: Payer: Self-pay

## 2019-08-23 ENCOUNTER — Other Ambulatory Visit: Payer: 59

## 2019-08-23 DIAGNOSIS — E785 Hyperlipidemia, unspecified: Secondary | ICD-10-CM

## 2019-08-24 LAB — ALT: ALT: 30 IU/L (ref 0–32)

## 2019-08-24 LAB — LIPID PANEL
Chol/HDL Ratio: 4.6 ratio — ABNORMAL HIGH (ref 0.0–4.4)
Cholesterol, Total: 233 mg/dL — ABNORMAL HIGH (ref 100–199)
HDL: 51 mg/dL (ref >39)
LDL Chol Calc (NIH): 159 mg/dL — ABNORMAL HIGH (ref 0–99)
Triglycerides: 128 mg/dL (ref 0–149)
VLDL Cholesterol Cal: 23 mg/dL (ref 5–40)

## 2019-08-25 ENCOUNTER — Other Ambulatory Visit: Payer: Self-pay

## 2019-08-25 ENCOUNTER — Encounter: Payer: Self-pay | Admitting: Physician Assistant

## 2019-08-25 ENCOUNTER — Ambulatory Visit (INDEPENDENT_AMBULATORY_CARE_PROVIDER_SITE_OTHER): Payer: 59 | Admitting: Physician Assistant

## 2019-08-25 VITALS — Ht 69.0 in | Wt 147.0 lb

## 2019-08-25 DIAGNOSIS — Z79899 Other long term (current) drug therapy: Secondary | ICD-10-CM | POA: Diagnosis not present

## 2019-08-25 DIAGNOSIS — E785 Hyperlipidemia, unspecified: Secondary | ICD-10-CM

## 2019-08-25 MED ORDER — ROSUVASTATIN CALCIUM 5 MG PO TABS: ORAL_TABLET | ORAL | 2 refills | Status: DC

## 2019-08-25 NOTE — Progress Notes (Signed)
Telehealth office visit note for Deborah Masker, PA-C- at Primary Care at [facility]   I connected with current patient today and verified that I am speaking with the correct person   . Location of the patient: Home . Location of the provider: Office - This visit type was conducted due to national recommendations for restrictions regarding the COVID-19 Pandemic (e.g. social distancing) in an effort to limit this patient's exposure and mitigate transmission in our community.    - No physical exam could be performed with this format, beyond that communicated to [place] by the patient/ family members as noted.   - Additionally my office staff/ schedulers were to discuss with the patient that there may be a monetary charge related to this service, depending on their medical insurance.  My understanding is that patient understood and consented to proceed.     _________________________________________________________________________________   History of Present Illness: Pt calls in to discuss lab results and follow-up on hyperlipidemia. Pt expresses disappointment with most recent lipid panel results because she has really worked on following a heart healthy diet by reducing red meats, processed foods, sweets and has been physically active. She has been on statins in the past, and did ok with Crestor 5 mg but when dose was increased to 10 mg she had significant myalgias, muscle weakness, and fatigue which really affected her quality of life and eventually had to discontinue the Crestor, even after dose was adjusted. Her symptoms resolved within a week of stopping her statin. She took CoQ10 during medication therapy but didn't notice a major difference in her symptoms. She has a strong family history for cardiovascular disease and she has atrial fibrillation, she verbalizes awareness she needs to have medication therapy for hyperlipidemia but is hesitant about statins due to side effects she has experienced.  In the past she has been on Simvastatin 5 mg and didn't realize some of the symptoms she had then were most likely due to the statin.      No flowsheet data found.  Depression screen [facility] 2/9 08/25/2019 08/12/2019 04/26/2019 02/21/2019 10/18/2018  Decreased Interest 0 0 0 0 0  Down, Depressed, Hopeless 0 0 0 1 0  PHQ - 2 Score 0 0 0 1 0  Altered sleeping 1 1 2 3 1   Tired, decreased energy 0 0 0 0 0  Change in appetite 0 0 0 0 0  Feeling bad or failure about yourself  0 0 0 0 0  Trouble concentrating 0 0 0 0 0  Moving slowly or fidgety/restless 0 0 0 0 0  Suicidal thoughts 0 0 0 0 0  PHQ-9 Score 1 1 2 4 1   Difficult doing work/chores Not difficult at all Not difficult at all Not difficult at all Somewhat difficult Not difficult at all      Impression and Recommendations:     1. Hyperlipidemia, unspecified hyperlipidemia type   2. On statin therapy     Hyperlipidemia - Most recent lipid panel: total cholesterol and LDL elevated. - Had thorough discussion with patient regarding various treatments for hyperlipidemia in addition to statin therapy including referral to lipid clinic. Pt agreeable to re-starting Crestor 5 mg 3 times weekly. - Encouraged to continue heart healthy diet and continue to stay as active as possible.  - Stay well hydrated, at least 64 fl oz - Return in 6 weeks for lipid and hepatic function recheck, and pending lab results will discuss next OV follow-up.       -  As part of my medical decision making, I reviewed the following data within the electronic MEDICAL RECORD NUMBER History obtained from pt /family, CMA notes reviewed and incorporated if applicable, Labs reviewed, Radiograph/ tests reviewed if applicable and OV notes from prior OV's with me, as well as any other specialists she/he has seen since seeing me last, were all reviewed and used in my medical decision making process today.    - Additionally, when appropriate, discussion had with patient regarding our  treatment plan, and their biases/concerns about that plan were used in my medical decision making today.    - The patient agreed with the plan and demonstrated an understanding of the instructions.   No barriers to understanding were identified.     - The patient was advised to call back or seek an in-person evaluation if the symptoms worsen or if the condition fails to improve as anticipated.   Return for lab visit only in 6 weeks (lipid, hepatic function); pending labs will determine OV f/up.    Orders Placed This Encounter  Procedures  . Lipid Profile  . Hepatic function panel    Meds ordered this encounter  Medications  . rosuvastatin (CRESTOR) 5 MG tablet    Sig: Take 1 tablet by mouth once daily on M,W,F (3 times/wk).    Dispense:  30 tablet    Refill:  2    Order Specific Question:   Supervising Provider    Answer:   Nani Gasser D [[id number]]    Medications Discontinued During This Encounter  Medication Reason  . rosuvastatin (CRESTOR) 10 MG tablet Dose change       Time spent on visit including pre-visit chart review and post-visit care was 25 minutes.      The 21st Century Cures Act was signed into law in 2016 which includes the topic of electronic health records.  This provides immediate access to information in MyChart.  This includes consultation notes, operative notes, office notes, lab results and pathology reports.  If you have any questions about what you read please let us know at your next visit or call us at the office.  We are right here with you.   __________________________________________________________________________________     Patient Care Team    Relationship Specialty Notifications Start End  Deborah Farmer, [place] PCP - General Physician Assistant  08/12/19   Purnell Shoemaker, MD Referring Physician Obstetrics and Gynecology  04/27/18      -Vitals obtained; medications/ allergies reconciled;  personal medical, social, Sx  etc.histories were updated by CMA, reviewed by me and are reflected in chart   Patient Active Problem List   Diagnosis Date Noted  . HTN (hypertension) 04/26/2019  . Insomnia 04/26/2019  . 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 Healthcare maintenance 04/27/2018  . Anxiety 04/27/2018  . Stress 04/27/2018  . Hyperlipidemia 04/27/2018  . Lumbar radicular pain 12/24/2015  . Beta-blocker intolerance 08/22/2014  . PAF (paroxysmal atrial fibrillation) ([facility]) 10/14/2013     Current Meds  Medication Sig  . ALPRAZolam (NIRAVAM) 0.25 MG dissolvable tablet Take 1 tablet (0.25 mg total) by mouth at bedtime as needed for anxiety. NO ADDITIONAL REFILLS  . atenolol (TENORMIN) 25 MG tablet Take 1 tablet by mouth daily.  Marland Kitchen DEXILANT 60 MG capsule Take 1 capsule by mouth daily.  . flecainide (TAMBOCOR) 100 MG tablet Take 1 tablet by mouth 2 (two) times daily.  . Lactobacillus Rhamnosus, GG, (CULTURELLE) CAPS Take 1 capsule by mouth daily.  . Multiple Vitamins-Minerals (MULTIVITAMIN  ADULT PO) Take 1 tablet by mouth daily.     Allergies:  No Known Allergies   ROS:  See above HPI for pertinent positives and negatives   Objective:   Height 5\' 9"  (1.753 m), weight 147 lb (66.7 kg).  (if some vitals are omitted, this means that patient was UNABLE to obtain them even though they were asked to get them prior to OV today.  They were asked to call us at their earliest convenience with these once obtained. ) General: A & O * 3; sounds in no acute distress; in usual state of health.  Skin: Pt confirms warm and dry extremities and pink fingertips Respiratory: speaking in full sentences, no conversational dyspnea; Psych: insight appears good, mood- appears full

## 2019-08-25 NOTE — Assessment & Plan Note (Addendum)
-   Most recent lipid panel: total cholesterol and LDL elevated. - Had thorough discussion with patient regarding various treatments for hyperlipidemia in addition to statin therapy including referral to lipid clinic. Pt agreeable to re-starting Crestor 5 mg 3 times weekly. - Encouraged to continue heart healthy diet and continue to stay as active as possible.  - Stay well hydrated, at least 64 fl oz - Return in 6 weeks for lipid and hepatic function recheck, and pending lab results will discuss next OV follow-up.

## 2019-10-11 ENCOUNTER — Other Ambulatory Visit: Payer: 59

## 2019-10-13 ENCOUNTER — Other Ambulatory Visit: Payer: Self-pay

## 2019-10-13 ENCOUNTER — Other Ambulatory Visit: Payer: 59

## 2019-10-13 DIAGNOSIS — Z79899 Other long term (current) drug therapy: Secondary | ICD-10-CM

## 2019-10-13 DIAGNOSIS — I10 Essential (primary) hypertension: Secondary | ICD-10-CM

## 2019-10-13 DIAGNOSIS — Z0000 Encounter for general adult medical examination without abnormal findings: Secondary | ICD-10-CM

## 2019-10-13 DIAGNOSIS — E785 Hyperlipidemia, unspecified: Secondary | ICD-10-CM

## 2019-10-14 LAB — COMPREHENSIVE METABOLIC PANEL
ALT: 21 IU/L (ref 0–32)
AST: 26 IU/L (ref 0–40)
Albumin/Globulin Ratio: 2.5 — ABNORMAL HIGH (ref 1.2–2.2)
Albumin: 4.8 g/dL (ref 3.8–4.8)
Alkaline Phosphatase: 67 IU/L (ref 48–121)
BUN/Creatinine Ratio: 18 (ref 12–28)
BUN: 14 mg/dL (ref 8–27)
Bilirubin Total: 0.7 mg/dL (ref 0.0–1.2)
CO2: 26 mmol/L (ref 20–29)
Calcium: 9.5 mg/dL (ref 8.7–10.3)
Chloride: 102 mmol/L (ref 96–106)
Creatinine, Ser: 0.79 mg/dL (ref 0.57–1.00)
GFR calc Af Amer: 92 mL/min/{1.73_m2} (ref >59)
GFR calc non Af Amer: 80 mL/min/{1.73_m2} (ref >59)
Globulin, Total: 1.9 g/dL (ref 1.5–4.5)
Glucose: 89 mg/dL (ref 65–99)
Potassium: 4.6 mmol/L (ref 3.5–5.2)
Sodium: 140 mmol/L (ref 134–144)
Total Protein: 6.7 g/dL (ref 6.0–8.5)

## 2019-10-14 LAB — CBC WITH DIFFERENTIAL/PLATELET
Basophils Absolute: 0 10*3/uL (ref 0.0–0.2)
Basos: 1 %
EOS (ABSOLUTE): 0.1 10*3/uL (ref 0.0–0.4)
Eos: 2 %
Hematocrit: 37.2 % (ref 34.0–46.6)
Hemoglobin: 12.4 g/dL (ref 11.1–15.9)
Immature Grans (Abs): 0 10*3/uL (ref 0.0–0.1)
Immature Granulocytes: 0 %
Lymphocytes Absolute: 1.5 10*3/uL (ref 0.7–3.1)
Lymphs: 35 %
MCH: 30.1 pg (ref 26.6–33.0)
MCHC: 33.3 g/dL (ref 31.5–35.7)
MCV: 90 fL (ref 79–97)
Monocytes Absolute: 0.3 10*3/uL (ref 0.1–0.9)
Monocytes: 8 %
Neutrophils Absolute: 2.2 10*3/uL (ref 1.4–7.0)
Neutrophils: 54 %
Platelets: 192 10*3/uL (ref 150–450)
RBC: 4.12 x10E6/uL (ref 3.77–5.28)
RDW: 11.9 % (ref 11.7–15.4)
WBC: 4.1 10*3/uL (ref 3.4–10.8)

## 2019-10-14 LAB — LIPID PANEL
Chol/HDL Ratio: 3.1 ratio (ref 0.0–4.4)
Cholesterol, Total: 165 mg/dL (ref 100–199)
HDL: 53 mg/dL (ref >39)
LDL Chol Calc (NIH): 90 mg/dL (ref 0–99)
Triglycerides: 123 mg/dL (ref 0–149)
VLDL Cholesterol Cal: 22 mg/dL (ref 5–40)

## 2019-10-14 LAB — HEMOGLOBIN A1C
Est. average glucose Bld gHb Est-mCnc: 111 mg/dL
Hgb A1c MFr Bld: 5.5 % (ref 4.8–5.6)

## 2019-10-14 LAB — TSH: TSH: 1.52 u[IU]/mL (ref 0.450–4.500)

## 2019-11-02 ENCOUNTER — Encounter: Payer: 59 | Admitting: Physician Assistant

## 2019-11-08 ENCOUNTER — Encounter: Payer: Self-pay | Admitting: Physician Assistant

## 2019-11-08 ENCOUNTER — Other Ambulatory Visit: Payer: Self-pay

## 2019-11-08 ENCOUNTER — Telehealth: Payer: Self-pay | Admitting: Physician Assistant

## 2019-11-08 ENCOUNTER — Ambulatory Visit (INDEPENDENT_AMBULATORY_CARE_PROVIDER_SITE_OTHER): Payer: 59 | Admitting: Physician Assistant

## 2019-11-08 VITALS — BP 117/71 | HR 57 | Ht 69.0 in | Wt 147.0 lb

## 2019-11-08 DIAGNOSIS — E785 Hyperlipidemia, unspecified: Secondary | ICD-10-CM

## 2019-11-08 DIAGNOSIS — G4481 Hypnic headache: Secondary | ICD-10-CM | POA: Diagnosis not present

## 2019-11-08 DIAGNOSIS — Z0000 Encounter for general adult medical examination without abnormal findings: Secondary | ICD-10-CM | POA: Diagnosis not present

## 2019-11-08 DIAGNOSIS — I159 Secondary hypertension, unspecified: Secondary | ICD-10-CM | POA: Diagnosis not present

## 2019-11-08 MED ORDER — ROSUVASTATIN CALCIUM 5 MG PO TABS: ORAL_TABLET | ORAL | 2 refills | Status: DC

## 2019-11-08 NOTE — Addendum Note (Signed)
Addended by: Sylvester Harder on: 11/08/2019 02:24 PM   Modules accepted: Orders

## 2019-11-08 NOTE — Telephone Encounter (Signed)
Rx sent to requested pharmacy. AS, CMA 

## 2019-11-08 NOTE — Patient Instructions (Signed)
Preventive Care 40-64 Years Old, Female Preventive care refers to visits with your health care provider and lifestyle choices that can promote health and wellness. This includes:  A yearly physical exam. This may also be called an annual well check.  Regular dental visits and eye exams.  Immunizations.  Screening for certain conditions.  Healthy lifestyle choices, such as eating a healthy diet, getting regular exercise, not using drugs or products that contain nicotine and tobacco, and limiting alcohol use. What can I expect for my preventive care visit? Physical exam Your health care provider will check your:  Height and weight. This may be used to calculate body mass index (BMI), which tells if you are at a healthy weight.  Heart rate and blood pressure.  Skin for abnormal spots. Counseling Your health care provider may ask you questions about your:  Alcohol, tobacco, and drug use.  Emotional well-being.  Home and relationship well-being.  Sexual activity.  Eating habits.  Work and work environment.  Method of birth control.  Menstrual cycle.  Pregnancy history. What immunizations do I need?  Influenza (flu) vaccine  This is recommended every year. Tetanus, diphtheria, and pertussis (Tdap) vaccine  You may need a Td booster every 10 years. Varicella (chickenpox) vaccine  You may need this if you have not been vaccinated. Zoster (shingles) vaccine  You may need this after age 60. Measles, mumps, and rubella (MMR) vaccine  You may need at least one dose of MMR if you were born in 1957 or later. You may also need a second dose. Pneumococcal conjugate (PCV13) vaccine  You may need this if you have certain conditions and were not previously vaccinated. Pneumococcal polysaccharide (PPSV23) vaccine  You may need one or two doses if you smoke cigarettes or if you have certain conditions. Meningococcal conjugate (MenACWY) vaccine  You may need this if you  have certain conditions. Hepatitis A vaccine  You may need this if you have certain conditions or if you travel or work in places where you may be exposed to hepatitis A. Hepatitis B vaccine  You may need this if you have certain conditions or if you travel or work in places where you may be exposed to hepatitis B. Haemophilus influenzae type b (Hib) vaccine  You may need this if you have certain conditions. Human papillomavirus (HPV) vaccine  If recommended by your health care provider, you may need three doses over 6 months. You may receive vaccines as individual doses or as more than one vaccine together in one shot (combination vaccines). Talk with your health care provider about the risks and benefits of combination vaccines. What tests do I need? Blood tests  Lipid and cholesterol levels. These may be checked every 5 years, or more frequently if you are over 50 years old.  Hepatitis C test.  Hepatitis B test. Screening  Lung cancer screening. You may have this screening every year starting at age 55 if you have a 30-pack-year history of smoking and currently smoke or have quit within the past 15 years.  Colorectal cancer screening. All adults should have this screening starting at age 50 and continuing until age 75. Your health care provider may recommend screening at age 45 if you are at increased risk. You will have tests every 1-10 years, depending on your results and the type of screening test.  Diabetes screening. This is done by checking your blood sugar (glucose) after you have not eaten for a while (fasting). You may have this   done every 1-3 years.  Mammogram. This may be done every 1-2 years. Talk with your health care provider about when you should start having regular mammograms. This may depend on whether you have a family history of breast cancer.  BRCA-related cancer screening. This may be done if you have a family history of breast, ovarian, tubal, or peritoneal  cancers.  Pelvic exam and Pap test. This may be done every 3 years starting at age 46. Starting at age 67, this may be done every 5 years if you have a Pap test in combination with an HPV test. Other tests  Sexually transmitted disease (STD) testing.  Bone density scan. This is done to screen for osteoporosis. You may have this scan if you are at high risk for osteoporosis. Follow these instructions at home: Eating and drinking  Eat a diet that includes fresh fruits and vegetables, whole grains, lean protein, and low-fat dairy.  Take vitamin and mineral supplements as recommended by your health care provider.  Do not drink alcohol if: ? Your health care provider tells you not to drink. ? You are pregnant, may be pregnant, or are planning to become pregnant.  If you drink alcohol: ? Limit how much you have to 0-1 drink a day. ? Be aware of how much alcohol is in your drink. In the [place] one drink equals one 12 oz bottle of beer (355 mL), one 5 oz glass of wine (148 mL), or one 1 oz glass of hard liquor (44 mL). Lifestyle  Take daily care of your teeth and gums.  Stay active. Exercise for at least 30 minutes on 5 or more days each week.  Do not use any products that contain nicotine or tobacco, such as cigarettes, e-cigarettes, and chewing tobacco. If you need help quitting, ask your health care provider.  If you are sexually active, practice safe sex. Use a condom or other form of birth control (contraception) in order to prevent pregnancy and STIs (sexually transmitted infections).  If told by your health care provider, take low-dose aspirin daily starting at age 25. What's next?  Visit your health care provider once a year for a well check visit.  Ask your health care provider how often you should have your eyes and teeth checked.  Stay up to date on all vaccines. This information is not intended to replace advice given to you by your health care provider. Make sure you  discuss any questions you have with your health care provider. Document Revised: 11/05/2017 Document Reviewed: 11/05/2017 Elsevier Patient Education  [place].   High Cholesterol  High cholesterol is a condition in which the blood has high levels of a white, waxy, fat-like substance (cholesterol). The human body needs small amounts of cholesterol. The liver makes all the cholesterol that the body needs. Extra (excess) cholesterol comes from the food that we eat. Cholesterol is carried from the liver by the blood through the blood vessels. If you have high cholesterol, deposits (plaques) may build up on the walls of your blood vessels (arteries). Plaques make the arteries narrower and stiffer. Cholesterol plaques increase your risk for heart attack and stroke. Work with your health care provider to keep your cholesterol levels in a healthy range. What increases the risk? This condition is more likely to develop in people who:  Eat foods that are high in animal fat (saturated fat) or cholesterol.  Are overweight.  Are not getting enough exercise.  Have a family history of high  cholesterol. What are the signs or symptoms? There are no symptoms of this condition. How is this diagnosed? This condition may be diagnosed from the results of a blood test.  If you are older than age 99, your health care provider may check your cholesterol every 4-6 years.  You may be checked more often if you already have high cholesterol or other risk factors for heart disease. The blood test for cholesterol measures:  "Bad" cholesterol (LDL cholesterol). This is the main type of cholesterol that causes heart disease. The desired level for LDL is less than 100.  "Good" cholesterol (HDL cholesterol). This type helps to protect against heart disease by cleaning the arteries and carrying the LDL away. The desired level for HDL is 60 or higher.  Triglycerides. These are fats that the body can store or  burn for energy. The desired number for triglycerides is lower than 150.  Total cholesterol. This is a measure of the total amount of cholesterol in your blood, including LDL cholesterol, HDL cholesterol, and triglycerides. A healthy number is less than 200. How is this treated? This condition is treated with diet changes, lifestyle changes, and medicines. Diet changes  This may include eating more whole grains, fruits, vegetables, nuts, and fish.  This may also include cutting back on red meat and foods that have a lot of added sugar. Lifestyle changes  Changes may include getting at least 40 minutes of aerobic exercise 3 times a week. Aerobic exercises include walking, biking, and swimming. Aerobic exercise along with a healthy diet can help you maintain a healthy weight.  Changes may also include quitting smoking. Medicines  Medicines are usually given if diet and lifestyle changes have failed to reduce your cholesterol to healthy levels.  Your health care provider may prescribe a statin medicine. Statin medicines have been shown to reduce cholesterol, which can reduce the risk of heart disease. Follow these instructions at home: Eating and drinking If told by your health care provider:  Eat chicken (without skin), fish, veal, shellfish, ground [place] breast, and round or loin cuts of red meat.  Do not eat fried foods or fatty meats, such as hot dogs and salami.  Eat plenty of fruits, such as apples.  Eat plenty of vegetables, such as broccoli, potatoes, and carrots.  Eat beans, peas, and lentils.  Eat grains such as barley, rice, couscous, and bulgur wheat.  Eat pasta without cream sauces.  Use skim or nonfat milk, and eat low-fat or nonfat yogurt and cheeses.  Do not eat or drink whole milk, cream, ice cream, egg yolks, or hard cheeses.  Do not eat stick margarine or tub margarines that contain trans fats (also called partially hydrogenated oils).  Do not eat  saturated tropical oils, such as coconut oil and palm oil.  Do not eat cakes, cookies, crackers, or other baked goods that contain trans fats.  General instructions  Exercise as directed by your health care provider. Increase your activity level with activities such as gardening, walking, and taking the stairs.  Take over-the-counter and prescription medicines only as told by your health care provider.  Do not use any products that contain nicotine or tobacco, such as cigarettes and e-cigarettes. If you need help quitting, ask your health care provider.  Keep all follow-up visits as told by your health care provider. This is important. Contact a health care provider if:  You are struggling to maintain a healthy diet or weight.  You need help to start on  an exercise program.  You need help to stop smoking. Get help right away if:  You have chest pain.  You have trouble breathing. This information is not intended to replace advice given to you by your health care provider. Make sure you discuss any questions you have with your health care provider. Document Revised: 02/27/2017 Document Reviewed: 08/25/2015 Elsevier Patient Education  [place].

## 2019-11-08 NOTE — Progress Notes (Signed)
Female Physical   Impression and Recommendations:    No diagnosis found.   1) Anticipatory Guidance: Discussed skin CA prevention and sunscreen when outside along with skin surveillance; eating a balanced and modest diet; physical activity at least 25 minutes per day or minimum of 150 min/ week moderate to intense activity.  2) Immunizations / Screenings / Labs:   All immunizations are up-to-date per recommendations or will be updated today if pt allows.    - Patient understands with dental and vision screens they will schedule independently.  - Obtianed CBC, CMP, HgA1c, Lipid panel, and TSH when fasting. Most labs were essentially within normal limits.  - UTD on immunizations, colonoscopy, pap smear and mammogram (followed by Ob-Gyn)   3) Weight:  BMI meaning discussed with patient.  Discussed goal to improve diet habits to improve overall feelings of well being and objective health data. Improve nutrient density of diet through increasing intake of fruits and vegetables and decreasing saturated fats, white flour products and refined sugars. -BMI 21.71, wnl  4) Healthcare Maintenance: -Follow a heart healthy diet and stay active. -Discussed with patient lipid panel significantly improved after starting Crestor 5 mg 3 times weekly, and patient is able tolerate myalgias/arthralgias.  Will repeat lipid panel in 4 months, and if it continues to improve will consider decreasing statin to twice weekly to help with side effects.  Patient verbalized understanding and agreeable. -Continue current medication regimen -Increase water hydration. -Recommend neurology referral and imaging if nocturnal headaches and vivid dreams worsen.  Continue Tylenol as needed for headache. -Follow-up in 4 months for regular OV: HTN, HLD, mood and repeat lipid panel and hepatic function   No orders of the defined types were placed in this encounter.   No orders of the defined types were placed in this  encounter.    No follow-ups on file.   Reminded pt important of f-up preventative CPE in 1 year.  Reminded pt again, this is in addition to any chronic care visits.    Gross side effects, risk and benefits, and alternatives of medications discussed with patient.  Patient is aware that all medications have potential side effects and we are unable to predict every side effect or drug-drug interaction that may occur.  Expresses verbal understanding and consents to current therapy plan and treatment regimen.  F-up preventative CPE in 1 year- reminded pt again, this is in addition to any chronic care visits.    Please see orders placed and AVS handed out to patient at the end of our visit for further patient instructions/ counseling done pertaining to today's office visit.   Subjective:     CPE HPI: Deborah Farmer is a 64 y.o. female who presents to [facility] today for a yearly health maintenance exam.   Health Maintenance Summary  - Reviewed and updated, unless pt declines services.  Last Cologuard or Colonoscopy:  01/09/2017- repeat in 10 years Family history of Colon CA:  N  Tobacco History Reviewed:  Y, never smoker Alcohol and/or drug use:  No concerns; no use Dental Home: Y Eye exams: Y Female Health:  PAP Smear - last known results: 03/24/2018- normal STD concerns:   none Lumps or breast concerns: none Breast Cancer Family History: No   Additional concerns beyond health maintenance issues: Nocturnal tension headache and vivid dreams, which have been occurring for more than 1 year. Denies restless sleep, confusion, memory changes, gait or behavior changes . Reports history of migraines  in adolescence. Usually takes Tylenol which helps resolve headache.     Immunization History  Administered Date(s) Administered  . Influenza, Quadrivalent, Recombinant, Inj, Pf 12/07/2018  . PFIZER SARS-COV-2 Vaccination 04/23/2019, 05/17/2019  . Tdap  05/05/2016  . Zoster 01/03/2016  . Zoster Recombinat (Shingrix) 10/18/2018, 03/16/2019     Health Maintenance  Topic Date Due  . INFLUENZA VACCINE  10/09/2019  . MAMMOGRAM  07/25/2020  . PAP SMEAR-Modifier  03/24/2021  . TETANUS/TDAP  05/05/2026  . COLONOSCOPY  01/10/2027  . COVID-19 Vaccine  Completed  . Hepatitis C Screening  Completed  . HIV Screening  Completed     Wt Readings from Last 3 Encounters:  11/08/19 147 lb (66.7 kg)  08/25/19 147 lb (66.7 kg)  08/12/19 148 lb 14.4 oz (67.5 kg)   BP Readings from Last 3 Encounters:  11/08/19 117/71  08/12/19 120/77  04/26/19 130/88   Pulse Readings from Last 3 Encounters:  11/08/19 (!) 57  08/12/19 70  02/21/19 67     Past Medical History:  Diagnosis Date  . Atrial fibrillation (HCC)   . Hyperlipidemia       Past Surgical History:  Procedure Laterality Date  . BREAST SURGERY     biopsy and augmentation      Family History  Problem Relation Age of Onset  . Heart attack Mother   . Hypertension Mother   . Congestive Heart Failure Mother   . Heart attack Father   . Diabetes Sister   . Diabetes Brother       Social History   Substance and Sexual Activity  Drug Use Never  ,   Social History   Substance and Sexual Activity  Alcohol Use Yes  . Alcohol/week: 2.0 standard drinks  . Types: 2 Glasses of wine per week  ,   Social History   Tobacco Use  Smoking Status Never Smoker  Smokeless Tobacco Never Used  ,   Social History   Substance and Sexual Activity  Sexual Activity Yes  . Birth control/protection: None    Current Outpatient Medications on File Prior to Visit  Medication Sig Dispense Refill  . atenolol (TENORMIN) 25 MG tablet Take 1 tablet by mouth daily.    Marland Kitchen DEXILANT 60 MG capsule Take 1 capsule by mouth daily.    Marland Kitchen escitalopram (LEXAPRO) 10 MG tablet Take 1 tablet (10 mg total) by mouth daily. (Patient taking differently: Take 5 mg by mouth daily. ) 90 tablet 0  .  flecainide (TAMBOCOR) 100 MG tablet Take 1 tablet by mouth 2 (two) times daily.    . Lactobacillus Rhamnosus, GG, (CULTURELLE) CAPS Take 1 capsule by mouth daily.    . Multiple Vitamins-Minerals (MULTIVITAMIN ADULT PO) Take 1 tablet by mouth daily.    . rosuvastatin (CRESTOR) 5 MG tablet Take 1 tablet by mouth once daily on M,W,F (3 times/wk). 30 tablet 2  . ALPRAZolam (NIRAVAM) 0.25 MG dissolvable tablet Take 1 tablet (0.25 mg total) by mouth at bedtime as needed for anxiety. NO ADDITIONAL REFILLS 15 tablet 0  . Melatonin 3-10 MG TABS 3-10mg  (Patient not taking: Reported on 08/12/2019)  0  . methocarbamol (ROBAXIN) 750 MG tablet Take 1 tablet (750 mg total) by mouth 3 (three) times daily as needed for muscle spasms. (Patient not taking: Reported on 08/25/2019) 90 tablet 0   No current facility-administered medications on file prior to visit.    Allergies: Patient has no known allergies.  Review of Systems: General:   Denies fever, chills,  unexplained weight loss.  Optho/Auditory:   Denies visual changes, blurred vision/LOV Respiratory:   Denies SOB, DOE more than baseline levels.   Cardiovascular:   Denies chest pain, palpitations, new onset peripheral edema  Gastrointestinal:   Denies nausea, vomiting, diarrhea.  Genitourinary: Denies dysuria, freq/ urgency, flank pain or discharge from genitals.  Endocrine:     Denies hot or cold intolerance, polyuria, polydipsia. Musculoskeletal:   Denies unexplained myalgias, joint swelling, unexplained arthralgias, gait problems.  Skin:  Denies rash, suspicious lesions Neurological:     Denies dizziness, unexplained weakness, numbness  Psychiatric/Behavioral:   Denies mood changes, suicidal or homicidal ideations, hallucinations    Objective:    Blood pressure 117/71, pulse (!) 57, height 5\' 9"  (1.753 m), weight 147 lb (66.7 kg), SpO2 100 %. Body mass index is 21.71 kg/m. General Appearance:    Alert, cooperative, no distress, appears stated age   Head:    Normocephalic, without obvious abnormality, atraumatic  Eyes:    PERRL, conjunctiva/corneas clear, EOM's intact, fundi    benign, both eyes  Ears:    Normal TM's and external ear canals, both ears  Nose:   Nares normal, septum midline, mucosa normal, no drainage    or sinus tenderness  Throat:   Lips w/o lesion, mucosa moist, and tongue normal; teeth and   gums normal  Neck:   Supple, symmetrical, trachea midline, no adenopathy;    thyroid:  no enlargement/tenderness/nodules; no carotid   bruit or JVD  Back:     Symmetric, no curvature, ROM normal, no CVA tenderness  Lungs:     Clear to auscultation bilaterally, respirations unlabored, no       Wh/ R/ R  Chest Wall:    No tenderness or gross deformity; normal excursion   Heart:    Regular rate and rhythm, S1 and S2 normal, no murmur, rub   or gallop  Breast Exam:   Deferred to OB/GYN  Abdomen:     Soft, non-tender, bowel sounds active all four quadrants, NO   G/R/R, no masses, no organomegaly  Genitalia:   Deferred to Ob-Gyn  Rectal:   Deferred to Ob-Gyn  Extremities:   Extremities normal, atraumatic, no cyanosis or gross edema  Pulses:   2+ and symmetric all extremities  Skin:   Warm, dry, Skin color, texture, turgor normal, no obvious rashes or lesions, varicosities noted Psych: No HI/SI, judgement and insight good, Euthymic mood. Full Affect.  Neurologic:   CNII-XII grossly intact, normal strength, sensation and reflexes throughout

## 2019-11-08 NOTE — Telephone Encounter (Signed)
Patient Crestor was supposed to go to [facility] not local [facility], can we please resend order to correct pharm?

## 2020-03-13 ENCOUNTER — Ambulatory Visit (INDEPENDENT_AMBULATORY_CARE_PROVIDER_SITE_OTHER): Payer: 59 | Admitting: Physician Assistant

## 2020-03-13 ENCOUNTER — Other Ambulatory Visit: Payer: Self-pay

## 2020-03-13 ENCOUNTER — Encounter: Payer: Self-pay | Admitting: Physician Assistant

## 2020-03-13 VITALS — BP 105/65 | HR 65 | Ht 69.0 in | Wt 151.0 lb

## 2020-03-13 DIAGNOSIS — E785 Hyperlipidemia, unspecified: Secondary | ICD-10-CM

## 2020-03-13 DIAGNOSIS — G4700 Insomnia, unspecified: Secondary | ICD-10-CM | POA: Diagnosis not present

## 2020-03-13 DIAGNOSIS — Z780 Asymptomatic menopausal state: Secondary | ICD-10-CM

## 2020-03-13 DIAGNOSIS — F411 Generalized anxiety disorder: Secondary | ICD-10-CM

## 2020-03-13 DIAGNOSIS — I159 Secondary hypertension, unspecified: Secondary | ICD-10-CM | POA: Diagnosis not present

## 2020-03-13 DIAGNOSIS — F439 Reaction to severe stress, unspecified: Secondary | ICD-10-CM | POA: Diagnosis not present

## 2020-03-13 DIAGNOSIS — M2550 Pain in unspecified joint: Secondary | ICD-10-CM

## 2020-03-13 DIAGNOSIS — E2839 Other primary ovarian failure: Secondary | ICD-10-CM

## 2020-03-13 DIAGNOSIS — M62838 Other muscle spasm: Secondary | ICD-10-CM

## 2020-03-13 MED ORDER — ESCITALOPRAM OXALATE 10 MG PO TABS: 5.0000 mg | ORAL_TABLET | Freq: Every day | ORAL | 1 refills | Status: DC

## 2020-03-13 MED ORDER — ATENOLOL 25 MG PO TABS: 25.0000 mg | ORAL_TABLET | Freq: Every day | ORAL | 0 refills | Status: DC

## 2020-03-13 MED ORDER — METHOCARBAMOL 750 MG PO TABS: 750.0000 mg | ORAL_TABLET | Freq: Three times a day (TID) | ORAL | 0 refills | Status: DC | PRN

## 2020-03-13 NOTE — Progress Notes (Signed)
Established Patient Office Visit  Subjective:  Patient ID: Deborah Farmer, female    DOB: 11-25-1955  Age: 65 y.o. MRN: [id number]  CC:  Chief Complaint  Patient presents with  . Hypertension  . Headache  . Hyperlipidemia    HPI Deborah Farmer presents for hypertension, hyperlipidemia and headaches.  HTN: Pt denies chest pain, palpitations, dizziness or lower extremity swelling. Taking medication as directed without side effects.  Reports is almost out of atenolol and has not been able to get a refill from cardiology yet. Does not check blood pressure at home regularly but does check it if does not feel well. Pt follows a low salt diet.  Tries to stay hydrated.  HLD: Pt taking medication as directed.  States history of experiencing stiffness and clicking sensation of middle finger, right hand pain especially at rest, shoulder and neck pain with intermittent popping and clicking, and inner knee/thigh pain.  Denies injury/trauma, redness or swelling.  Unsure if related to statin or possible arthritis.  Has seen orthopedics in the past for hip/IT band issues.   Headaches: Reports headaches have improved and are not as frequent as before.  Mood: Taking half tablet of Lexapro 10 mg which seems to work well.  Denies SI/HI.  Past Medical History:  Diagnosis Date  . Atrial fibrillation ([place])   . Hyperlipidemia     Past Surgical History:  Procedure Laterality Date  . BREAST SURGERY     biopsy and augmentation    Family History  Problem Relation Age of Onset  . Heart attack Mother   . Hypertension Mother   . Congestive Heart Failure Mother   . Heart attack Father   . Diabetes Sister   . Diabetes Brother     Social History   Socioeconomic History  . Marital status: Married    Spouse name: Not on file  . Number of children: Not on file  . Years of education: Not on file  . Highest education level: Not on file  Occupational History  . Not on file  Tobacco Use  . Smoking  status: Never Smoker  . Smokeless tobacco: Never Used  Vaping Use  . Vaping Use: Never used  Substance and Sexual Activity  . Alcohol use: Yes    Alcohol/week: 2.0 standard drinks    Types: 2 Glasses of wine per week  . Drug use: Never  . Sexual activity: Yes    Birth control/protection: None  Other Topics Concern  . Not on file  Social History Narrative  . Not on file   Social Determinants of Health   Financial Resource Strain: Not on file  Food Insecurity: Not on file  Transportation Needs: Not on file  Physical Activity: Not on file  Stress: Not on file  Social Connections: Not on file  Intimate Partner Violence: Not on file    Outpatient Medications Prior to Visit  Medication Sig Dispense Refill  . DEXILANT 60 MG capsule Take 1 capsule by mouth daily.    . flecainide (TAMBOCOR) 100 MG tablet Take 1 tablet by mouth 2 (two) times daily.    . Lactobacillus Rhamnosus, GG, (CULTURELLE) CAPS Take 1 capsule by mouth daily.    . Multiple Vitamins-Minerals (MULTIVITAMIN ADULT PO) Take 1 tablet by mouth daily.    . rosuvastatin (CRESTOR) 5 MG tablet Take 1 tablet by mouth once daily on M,W,F (3 times/wk). 30 tablet 2  . atenolol (TENORMIN) 25 MG tablet Take 1 tablet by mouth daily.    Marland Kitchen  escitalopram (LEXAPRO) 10 MG tablet Take 1 tablet (10 mg total) by mouth daily. (Patient taking differently: Take 5 mg by mouth daily.) 90 tablet 0  . methocarbamol (ROBAXIN) 750 MG tablet Take 1 tablet (750 mg total) by mouth 3 (three) times daily as needed for muscle spasms. 90 tablet 0   No facility-administered medications prior to visit.    No Known Allergies  ROS Review of Systems A fourteen system review of systems was performed and found to be positive as per HPI.  Objective:    Physical Exam General:  Well Developed, well nourished, appropriate for stated age.  Neuro:  Alert and oriented,  extra-ocular muscles intact  HEENT:  Normocephalic, atraumatic, neck supple Skin:  no  gross rash, warm, pink. Cardiac:  RRR, S1 S2 Respiratory:  ECTA B/L, Not using accessory muscles, speaking in full sentences- unlabored. MSK: Good ROM, good strength of UE and LE, good grip strength, mild PIP and DIP thickening of both hands, negative Tinel's sign of both hands, negative Anterior and Posterior drawer tests, no effusion noted, negative McMurray's test Vascular:  Ext warm, no cyanosis apprec.; cap RF less 2 sec. Psych:  No HI/SI, judgement and insight good, Euthymic mood. Full Affect.   BP 105/65   Pulse 65   Ht 5' 9" (1.753 m)   Wt 151 lb (68.5 kg)   SpO2 98%   BMI 22.30 kg/m  Wt Readings from Last 3 Encounters:  03/13/20 151 lb (68.5 kg)  11/08/19 147 lb (66.7 kg)  08/25/19 147 lb (66.7 kg)     Health Maintenance Due  Topic Date Due  . INFLUENZA VACCINE  10/09/2019  . COVID-19 Vaccine (3 - Booster for Pfizer series) 11/17/2019    There are no preventive care reminders to display for this patient.  Lab Results  Component Value Date   TSH 1.520 10/13/2019   Lab Results  Component Value Date   WBC 4.1 10/13/2019   HGB 12.4 10/13/2019   HCT 37.2 10/13/2019   MCV 90 10/13/2019   PLT 192 10/13/2019   Lab Results  Component Value Date   NA 140 03/13/2020   K 4.4 03/13/2020   CO2 27 03/13/2020   GLUCOSE 84 03/13/2020   BUN 16 03/13/2020   CREATININE 0.72 03/13/2020   BILITOT 0.3 03/13/2020   ALKPHOS 72 03/13/2020   AST 29 03/13/2020   ALT 29 03/13/2020   PROT 7.6 03/13/2020   ALBUMIN 5.0 (H) 03/13/2020   CALCIUM 9.7 03/13/2020   Lab Results  Component Value Date   CHOL 165 10/13/2019   Lab Results  Component Value Date   HDL 53 10/13/2019   Lab Results  Component Value Date   LDLCALC 90 10/13/2019   Lab Results  Component Value Date   TRIG 123 10/13/2019   Lab Results  Component Value Date   CHOLHDL 3.1 10/13/2019   Lab Results  Component Value Date   HGBA1C 5.5 10/13/2019      Assessment & Plan:   Problem List Items  Addressed This Visit      Cardiovascular and Mediastinum   HTN (hypertension) - Primary (Chronic)   Relevant Medications   atenolol (TENORMIN) 25 MG tablet   Other Relevant Orders   Comp Met (CMET) (Completed)     Other   Hyperlipidemia (Chronic)   Relevant Medications   atenolol (TENORMIN) 25 MG tablet   Other Relevant Orders   Direct LDL (Completed)   Stress   Relevant Medications   escitalopram (LEXAPRO)  10 MG tablet   Insomnia   Relevant Medications   escitalopram (LEXAPRO) 10 MG tablet    Other Visit Diagnoses    GAD (generalized anxiety disorder)       Relevant Medications   escitalopram (LEXAPRO) 10 MG tablet   Muscle spasm       Relevant Medications   methocarbamol (ROBAXIN) 750 MG tablet   Polyarthralgia       Relevant Orders   Rheumatoid Factor (Completed)   CYCLIC CITRUL PEPTIDE ANTIBODY, IGG/IGA (Completed)   Antinuclear Antib (ANA) (Completed)   DG Cervical Spine Complete   DG Hand Complete Left   DG Hand Complete Right   DG Forearm Right   Post-menopausal       Relevant Orders   DG Bone Density   Estrogen deficiency       Relevant Orders   DG Bone Density     Hypertension: -Controlled. -Continue current medication regimen.  Provided a 30-day supply and advised to contact cardiology for further refills. -Continue low-sodium diet and stay well-hydrated. -Will continue to monitor.  Hyperlipidemia: -Last lipid panel: Total cholesterol 165, triglycerides 123, HDL 53, LDL 90 (improved from prior). -Discussed with patient alternative statin therapy with potentially less side effects to see if symptoms improve (arthralgias). Patient is nonfasting today so will collect direct LDL. Recommend to stay on statin therapy and patient is agreeable.  Recommend to stay well-hydrated. -Follow a heart healthy diet low in saturated and transfats and continue to stay as active as possible. -Will continue to monitor.  GAD, insomnia, stress: -PHQ-9 score of 2,  stable -Continue current medication regimen.  Provided refill. -Recommend to incorporate mindfulness therapy. Will continue to monitor.  Polyarthralgia: -Will place lab orders for further evaluation for possible autoimmune condition or electrolyte imbalance. -Will place xray orders to evaluate for bony abnormalities.  -If symptoms fail to improve or worsen recommend to follow up with Orthopedics.  -Discussed with patient if work-up is essentially negative then recommend to consider changing statin.   Meds ordered this encounter  Medications  . escitalopram (LEXAPRO) 10 MG tablet    Sig: Take 0.5 tablets (5 mg total) by mouth daily.    Dispense:  45 tablet    Refill:  1  . methocarbamol (ROBAXIN) 750 MG tablet    Sig: Take 1 tablet (750 mg total) by mouth 3 (three) times daily as needed for muscle spasms.    Dispense:  90 tablet    Refill:  0  . atenolol (TENORMIN) 25 MG tablet    Sig: Take 1 tablet (25 mg total) by mouth daily.    Dispense:  30 tablet    Refill:  0    Order Specific Question:   Supervising Provider    Answer:   Beatrice Lecher D [[id number]]    Follow-up: Return in about 4 months (around 07/11/2020) for HTN, HLD.   Note:  This note was prepared with assistance of Dragon voice recognition software. Occasional wrong-word or sound-a-like substitutions may have occurred due to the inherent limitations of voice recognition software.  Deborah Reid, PA-C

## 2020-03-13 NOTE — Patient Instructions (Addendum)
[facility] [address] [place], [place] [address] [phone]  Arthritis Arthritis means joint pain. It can also mean joint disease. A joint is a place where bones come together. There are more than 100 types of arthritis. What are the causes? This condition may be caused by:  Wear and tear of a joint. This is the most common cause.  A lot of acid in the blood, which leads to pain in the joint (gout).  Pain and swelling (inflammation) in a joint.  Infection of a joint.  Injuries in the joint.  A reaction to medicines (allergy). In some cases, the cause may not be known. What are the signs or symptoms? Symptoms of this condition include:  Redness at a joint.  Swelling at a joint.  Stiffness at a joint.  Warmth coming from the joint.  A fever.  A feeling of being sick. How is this treated? This condition may be treated with:  Treating the cause, if it is known.  Rest.  Raising (elevating) the joint.  Putting cold or hot packs on the joint.  Medicines to treat symptoms and reduce pain and swelling.  Shots of medicines (cortisone) into the joint. You may also be told to make changes in your life, such as doing exercises and losing weight. Follow these instructions at home: Medicines  Take over-the-counter and prescription medicines only as told by your doctor.  Do not take aspirin for pain if your doctor says that you may have gout. Activity  Rest your joint if your doctor tells you to.  Avoid activities that make the pain worse.  Exercise your joint regularly as told by your doctor. Try doing exercises like: ? Swimming. ? Water aerobics. ? Biking. ? Walking. Managing pain, stiffness, and swelling      If told, put ice on the affected area. ? Put ice in a plastic bag. ? Place a towel between your skin and the bag. ? Leave the ice on for 20 minutes, 2-3 times per day.  If your joint is swollen, raise (elevate) it above the level of  your heart if told by your doctor.  If your joint feels stiff in the morning, try taking a warm shower.  If told, put heat on the affected area. Do this as often as told by your doctor. Use the heat source that your doctor recommends, such as a moist heat pack or a heating pad. If you have diabetes, do not apply heat without asking your doctor. To apply heat: ? Place a towel between your skin and the heat source. ? Leave the heat on for 20-30 minutes. ? Remove the heat if your skin turns bright red. This is very important if you are unable to feel pain, heat, or cold. You may have a greater risk of getting burned. General instructions  Do not use any products that contain nicotine or tobacco, such as cigarettes, e-cigarettes, and chewing tobacco. If you need help quitting, ask your doctor.  Keep all follow-up visits as told by your doctor. This is important. Contact a doctor if:  The pain gets worse.  You have a fever. Get help right away if:  You have very bad pain in your joint.  You have swelling in your joint.  Your joint is red.  Many joints become painful and swollen.  You have very bad back pain.  Your leg is very weak.  You cannot control your pee (urine) or poop (stool). Summary  Arthritis means joint pain.  It can also mean joint disease. A joint is a place where bones come together.  The most common cause of this condition is wear and tear of a joint.  Symptoms of this condition include redness, swelling, or stiffness of the joint.  This condition is treated with rest, raising the joint, medicines, and putting cold or hot packs on the joint.  Follow your doctor's instructions about medicines, activity, exercises, and other home care treatments. This information is not intended to replace advice given to you by your health care provider. Make sure you discuss any questions you have with your health care provider. Document Revised: 02/01/2018 Document Reviewed:  02/01/2018 Elsevier Patient Education  2020 [employer].

## 2020-03-15 LAB — COMPREHENSIVE METABOLIC PANEL
ALT: 29 IU/L (ref 0–32)
AST: 29 IU/L (ref 0–40)
Albumin/Globulin Ratio: 1.9 (ref 1.2–2.2)
Albumin: 5 g/dL — ABNORMAL HIGH (ref 3.8–4.8)
Alkaline Phosphatase: 72 IU/L (ref 44–121)
BUN/Creatinine Ratio: 22 (ref 12–28)
BUN: 16 mg/dL (ref 8–27)
Bilirubin Total: 0.3 mg/dL (ref 0.0–1.2)
CO2: 27 mmol/L (ref 20–29)
Calcium: 9.7 mg/dL (ref 8.7–10.3)
Chloride: 100 mmol/L (ref 96–106)
Creatinine, Ser: 0.72 mg/dL (ref 0.57–1.00)
GFR calc Af Amer: 102 mL/min/{1.73_m2} (ref >59)
GFR calc non Af Amer: 89 mL/min/{1.73_m2} (ref >59)
Globulin, Total: 2.6 g/dL (ref 1.5–4.5)
Glucose: 84 mg/dL (ref 65–99)
Potassium: 4.4 mmol/L (ref 3.5–5.2)
Sodium: 140 mmol/L (ref 134–144)
Total Protein: 7.6 g/dL (ref 6.0–8.5)

## 2020-03-15 LAB — ANA: Anti Nuclear Antibody (ANA): NEGATIVE

## 2020-03-15 LAB — RHEUMATOID FACTOR: Rheumatoid fact SerPl-aCnc: <10 [IU]/mL (ref <14.0)

## 2020-03-15 LAB — CYCLIC CITRUL PEPTIDE ANTIBODY, IGG/IGA: Cyclic Citrullin Peptide Ab: 3 units (ref 0–19)

## 2020-03-15 LAB — LDL CHOLESTEROL, DIRECT: LDL Direct: 127 mg/dL — ABNORMAL HIGH (ref 0–99)

## 2020-04-04 ENCOUNTER — Other Ambulatory Visit: Payer: Self-pay | Admitting: Physician Assistant

## 2020-04-04 ENCOUNTER — Other Ambulatory Visit: Payer: Self-pay

## 2020-04-04 ENCOUNTER — Ambulatory Visit
Admission: RE | Admit: 2020-04-04 | Discharge: 2020-04-04 | Disposition: A | Discharge status: Home / Self Care | Payer: 59 | Source: Ambulatory Visit | Attending: Physician Assistant | Admitting: Physician Assistant

## 2020-04-04 DIAGNOSIS — I159 Secondary hypertension, unspecified: Secondary | ICD-10-CM

## 2020-04-04 DIAGNOSIS — M2550 Pain in unspecified joint: Secondary | ICD-10-CM

## 2020-04-05 ENCOUNTER — Other Ambulatory Visit: Payer: Self-pay | Admitting: Physician Assistant

## 2020-04-05 ENCOUNTER — Encounter: Payer: Self-pay | Admitting: Physician Assistant

## 2020-04-05 DIAGNOSIS — E2839 Other primary ovarian failure: Secondary | ICD-10-CM

## 2020-04-05 DIAGNOSIS — Z780 Asymptomatic menopausal state: Secondary | ICD-10-CM

## 2020-05-07 ENCOUNTER — Other Ambulatory Visit: Payer: Self-pay | Admitting: Physician Assistant

## 2020-05-07 DIAGNOSIS — Z1231 Encounter for screening mammogram for malignant neoplasm of breast: Secondary | ICD-10-CM

## 2020-06-27 ENCOUNTER — Ambulatory Visit
Admission: RE | Admit: 2020-06-27 | Discharge: 2020-06-27 | Disposition: A | Discharge status: Home / Self Care | Payer: 59 | Source: Ambulatory Visit | Attending: Physician Assistant | Admitting: Physician Assistant

## 2020-06-27 ENCOUNTER — Other Ambulatory Visit: Payer: Self-pay

## 2020-06-27 DIAGNOSIS — Z1231 Encounter for screening mammogram for malignant neoplasm of breast: Secondary | ICD-10-CM

## 2020-07-11 ENCOUNTER — Telehealth: Payer: Self-pay | Admitting: Physician Assistant

## 2020-07-11 ENCOUNTER — Ambulatory Visit: Payer: 59 | Admitting: Physician Assistant

## 2020-07-11 ENCOUNTER — Encounter: Payer: Self-pay | Admitting: Physician Assistant

## 2020-07-11 ENCOUNTER — Other Ambulatory Visit: Payer: Self-pay

## 2020-07-11 VITALS — BP 104/70 | HR 58 | Temp 97.7°F | Ht 69.0 in | Wt 151.0 lb

## 2020-07-11 DIAGNOSIS — F411 Generalized anxiety disorder: Secondary | ICD-10-CM | POA: Diagnosis not present

## 2020-07-11 DIAGNOSIS — E785 Hyperlipidemia, unspecified: Secondary | ICD-10-CM

## 2020-07-11 DIAGNOSIS — B002 Herpesviral gingivostomatitis and pharyngotonsillitis: Secondary | ICD-10-CM

## 2020-07-11 DIAGNOSIS — I480 Paroxysmal atrial fibrillation: Secondary | ICD-10-CM | POA: Diagnosis not present

## 2020-07-11 DIAGNOSIS — I159 Secondary hypertension, unspecified: Secondary | ICD-10-CM

## 2020-07-11 DIAGNOSIS — R5383 Other fatigue: Secondary | ICD-10-CM

## 2020-07-11 MED ORDER — ATENOLOL 25 MG PO TABS: 25.0000 mg | ORAL_TABLET | Freq: Every day | ORAL | 0 refills | Status: DC

## 2020-07-11 MED ORDER — VALACYCLOVIR HCL 1 G PO TABS: ORAL_TABLET | ORAL | 0 refills | Status: DC

## 2020-07-11 NOTE — Progress Notes (Signed)
Established Patient Office Visit  Subjective:  Patient ID: Deborah Farmer, female    DOB: 1955/09/29  Age: 65 y.o. MRN: [id number]  CC:  Chief Complaint  Patient presents with  . Follow-up    Cholesterol and BP    HPI Deborah Farmer presents for follow up on hyperlipidemia. Patient is requesting new prescription for Valtrex. Reports gets cold sore once or twice per year which is usually stress induced.   HLD: Pt taking medication as directed and overall tolerating ok. Continues to have some joint pain (hand, right knee). States joined the gym in Feb and goes 3-5 times/wk for 1 hr sessions. Continues to monitor fats.   PAF: Taking medication as directed without issues. Denies arrhyhtmia. Requesting a short term supply of medication. States mail order has been shipped but concerned will not arrive on time and only has 1-2 pills left of atenolol.   HTN: Denies chest pain, palpitations, dyspnea, or lower extremity swelling. Does report feeling more tired than normal and unsure if possibly related to exercising regimen. Taking medication as directed. Pt continues with hydration and low sodium diet.  Mood: Patient reports is under significant stress but feel like Lexapro 5 mg is doing fine. Denies SI/HI.  Past Medical History:  Diagnosis Date  . Atrial fibrillation ([place])   . Hyperlipidemia     Past Surgical History:  Procedure Laterality Date  . AUGMENTATION MAMMAPLASTY Bilateral 2006  . BREAST BIOPSY Left 1997  . BREAST SURGERY     biopsy and augmentation    Family History  Problem Relation Age of Onset  . Heart attack Mother   . Hypertension Mother   . Congestive Heart Failure Mother   . Heart attack Father   . Diabetes Sister   . Diabetes Brother     Social History   Socioeconomic History  . Marital status: Married    Spouse name: Not on file  . Number of children: Not on file  . Years of education: Not on file  . Highest education level: Not on file  Occupational  History  . Not on file  Tobacco Use  . Smoking status: Never Smoker  . Smokeless tobacco: Never Used  Vaping Use  . Vaping Use: Never used  Substance and Sexual Activity  . Alcohol use: Yes    Alcohol/week: 2.0 standard drinks    Types: 2 Glasses of wine per week  . Drug use: Never  . Sexual activity: Yes    Birth control/protection: None  Other Topics Concern  . Not on file  Social History Narrative  . Not on file   Social Determinants of Health   Financial Resource Strain: Not on file  Food Insecurity: Not on file  Transportation Needs: Not on file  Physical Activity: Not on file  Stress: Not on file  Social Connections: Not on file  Intimate Partner Violence: Not on file    Outpatient Medications Prior to Visit  Medication Sig Dispense Refill  . DEXILANT 60 MG capsule Take 1 capsule by mouth daily.    . flecainide (TAMBOCOR) 100 MG tablet Take 1 tablet by mouth 2 (two) times daily.    . Lactobacillus Rhamnosus, GG, (CULTURELLE) CAPS Take 1 capsule by mouth daily.    . methocarbamol (ROBAXIN) 750 MG tablet Take 1 tablet (750 mg total) by mouth 3 (three) times daily as needed for muscle spasms. 90 tablet 0  . Multiple Vitamins-Minerals (MULTIVITAMIN ADULT PO) Take 1 tablet by mouth daily.    Marland Kitchen  atenolol (TENORMIN) 25 MG tablet TAKE 1 TABLET (25 MG TOTAL) BY MOUTH DAILY. 90 tablet 0  . escitalopram (LEXAPRO) 10 MG tablet Take 0.5 tablets (5 mg total) by mouth daily. 45 tablet 1  . rosuvastatin (CRESTOR) 5 MG tablet Take 1 tablet by mouth once daily on M,W,F (3 times/wk). 30 tablet 2  . valACYclovir (VALTREX) 1000 MG tablet Take 1,000 mg by mouth 2 (two) times daily. Take 1 tablet by mouth twice a day     No facility-administered medications prior to visit.    No Known Allergies  ROS Review of Systems A fourteen system review of systems was performed and found to be positive as per HPI.   Objective:    Physical Exam General:  Well Developed, well nourished, in no  acute distress  Neuro:  Alert and oriented,  extra-ocular muscles intact  HEENT:  Normocephalic, atraumatic, neck supple Skin:  no gross rash, warm, pink. Cardiac:  RRR, S1 S2 Respiratory:  ECTA B/L w/o wheezing, crackles or rales, Not using accessory muscles, speaking in full sentences- unlabored. Vascular:  Ext warm, no cyanosis apprec.; cap RF less 2 sec. Psych:  No HI/SI, judgement and insight good, Euthymic mood. Full Affect.   BP 104/70   Pulse (!) 58   Temp 97.7 F (36.5 C)   Ht '5\' 9"'  (1.753 m)   Wt 151 lb (68.5 kg)   SpO2 100%   BMI 22.30 kg/m  Wt Readings from Last 3 Encounters:  07/11/20 151 lb (68.5 kg)  03/13/20 151 lb (68.5 kg)  11/08/19 147 lb (66.7 kg)     There are no preventive care reminders to display for this patient.  There are no preventive care reminders to display for this patient.  Lab Results  Component Value Date   TSH 0.845 07/11/2020   Lab Results  Component Value Date   WBC 3.7 07/11/2020   HGB 12.4 07/11/2020   HCT 37.7 07/11/2020   MCV 91 07/11/2020   PLT 198 07/11/2020   Lab Results  Component Value Date   NA 142 07/11/2020   K 4.3 07/11/2020   CO2 24 07/11/2020   GLUCOSE 95 07/11/2020   BUN 17 07/11/2020   CREATININE 0.79 07/11/2020   BILITOT 0.5 07/11/2020   ALKPHOS 67 07/11/2020   AST 34 07/11/2020   ALT 28 07/11/2020   PROT 7.1 07/11/2020   ALBUMIN 5.0 (H) 07/11/2020   CALCIUM 9.8 07/11/2020   EGFR 83 07/11/2020   Lab Results  Component Value Date   CHOL 203 (H) 07/11/2020   Lab Results  Component Value Date   HDL 65 07/11/2020   Lab Results  Component Value Date   LDLCALC 120 (H) 07/11/2020   Lab Results  Component Value Date   TRIG 99 07/11/2020   Lab Results  Component Value Date   CHOLHDL 3.1 07/11/2020   Lab Results  Component Value Date   HGBA1C 5.5 10/13/2019      Assessment & Plan:   Problem List Items Addressed This Visit      Cardiovascular and Mediastinum   HTN (hypertension)  (Chronic)   Relevant Medications   atenolol (TENORMIN) 25 MG tablet   PAF (paroxysmal atrial fibrillation) (HCC)   Relevant Medications   atenolol (TENORMIN) 25 MG tablet     Digestive   Oral herpes simplex infection   Relevant Medications   valACYclovir (VALTREX) 1000 MG tablet     Other   Hyperlipidemia - Primary (Chronic)   Relevant Medications  atenolol (TENORMIN) 25 MG tablet   Other Relevant Orders   Comp Met (CMET) (Completed)   Lipid Profile (Completed)    Other Visit Diagnoses    GAD (generalized anxiety disorder)       Fatigue, unspecified type       Relevant Orders   Vitamin D (25 hydroxy) (Completed)   CBC w/Diff (Completed)   TSH (Completed)     PAF: -Controlled.  -Followed by Cardiology. -On atenolol 25 mg. Provided short supply.  HTN: -Soft blood pressure and pulse. -Patient is on atenolol 25 mg and advised trial of 12.5 mg if  Fatigue possibly related to low normal BP. Patient prefers to continue current medication regimen. -Recommend to monitor BP/pulse at home.  -Stay well hydrated. -Will continue to monitor.  Hyperlipidemia: -Last direct LDL 127, patient is fasting today so will collect lipid panel and hepatic function. -Continue current medication regimen. Continue CoQ10. Pending lipid panel results, if LDL above goal then will consider treatment adjustments such as changing to different statin or trial Zetia. Patient has significant side effects with higher dosing or frequency of statin therapy.  -Continue heart healthy diet and physical activity regimen. -Will continue to monitor.  GAD: -Stable. -Continue current medication regimen. -Will continue to monitor.  Oral herpes simplex infection: -Will provide refill of Valacyclovir for recurrent episode.   Fatigue: -Will place lab orders to evaluate for possible etiologies such as metabolic, endocrine or nutritional deficiency.  -Soft blood pressure and pulse possibly contributing to  symptoms. Patient declined decreasing beta blocker therapy.    Meds ordered this encounter  Medications  . atenolol (TENORMIN) 25 MG tablet    Sig: Take 1 tablet (25 mg total) by mouth daily.    Dispense:  10 tablet    Refill:  0    Order Specific Question:   Supervising Provider    Answer:   Beatrice Lecher D [[id number]]  . valACYclovir (VALTREX) 1000 MG tablet    Sig: Take 2 tablets by mouth twice daily for 1 dose.    Dispense:  30 tablet    Refill:  0    Order Specific Question:   Supervising Provider    Answer:   Beatrice Lecher D [[id number]]    Follow-up: Return in about 4 months (around 11/11/2020) for CPE.    Lorrene Reid, PA-C

## 2020-07-11 NOTE — Telephone Encounter (Signed)
Patient states she received a message from the pharmacy that it is too soon to fill her atenolol. July is the soonest they stated to refill it. Patient uses [facility] in [place] at [facility]. Thanks

## 2020-07-11 NOTE — Telephone Encounter (Signed)
Left msg for patient to call back. AS, CMA 

## 2020-07-11 NOTE — Telephone Encounter (Signed)
Patient is returning your call. (CMA) Deborah Farmer. Thanks

## 2020-07-12 LAB — LIPID PANEL
Chol/HDL Ratio: 3.1 ratio (ref 0.0–4.4)
Cholesterol, Total: 203 mg/dL — ABNORMAL HIGH (ref 100–199)
HDL: 65 mg/dL (ref >39)
LDL Chol Calc (NIH): 120 mg/dL — ABNORMAL HIGH (ref 0–99)
Triglycerides: 99 mg/dL (ref 0–149)
VLDL Cholesterol Cal: 18 mg/dL (ref 5–40)

## 2020-07-12 LAB — CBC WITH DIFFERENTIAL/PLATELET
Basophils Absolute: 0 10*3/uL (ref 0.0–0.2)
Basos: 1 %
EOS (ABSOLUTE): 0.1 10*3/uL (ref 0.0–0.4)
Eos: 2 %
Hematocrit: 37.7 % (ref 34.0–46.6)
Hemoglobin: 12.4 g/dL (ref 11.1–15.9)
Immature Grans (Abs): 0 10*3/uL (ref 0.0–0.1)
Immature Granulocytes: 0 %
Lymphocytes Absolute: 1.4 10*3/uL (ref 0.7–3.1)
Lymphs: 38 %
MCH: 29.9 pg (ref 26.6–33.0)
MCHC: 32.9 g/dL (ref 31.5–35.7)
MCV: 91 fL (ref 79–97)
Monocytes Absolute: 0.3 10*3/uL (ref 0.1–0.9)
Monocytes: 7 %
Neutrophils Absolute: 1.9 10*3/uL (ref 1.4–7.0)
Neutrophils: 52 %
Platelets: 198 10*3/uL (ref 150–450)
RBC: 4.15 x10E6/uL (ref 3.77–5.28)
RDW: 12 % (ref 11.7–15.4)
WBC: 3.7 10*3/uL (ref 3.4–10.8)

## 2020-07-12 LAB — COMPREHENSIVE METABOLIC PANEL
ALT: 28 IU/L (ref 0–32)
AST: 34 IU/L (ref 0–40)
Albumin/Globulin Ratio: 2.4 — ABNORMAL HIGH (ref 1.2–2.2)
Albumin: 5 g/dL — ABNORMAL HIGH (ref 3.8–4.8)
Alkaline Phosphatase: 67 IU/L (ref 44–121)
BUN/Creatinine Ratio: 22 (ref 12–28)
BUN: 17 mg/dL (ref 8–27)
Bilirubin Total: 0.5 mg/dL (ref 0.0–1.2)
CO2: 24 mmol/L (ref 20–29)
Calcium: 9.8 mg/dL (ref 8.7–10.3)
Chloride: 102 mmol/L (ref 96–106)
Creatinine, Ser: 0.79 mg/dL (ref 0.57–1.00)
Globulin, Total: 2.1 g/dL (ref 1.5–4.5)
Glucose: 95 mg/dL (ref 65–99)
Potassium: 4.3 mmol/L (ref 3.5–5.2)
Sodium: 142 mmol/L (ref 134–144)
Total Protein: 7.1 g/dL (ref 6.0–8.5)
eGFR: 83 mL/min/{1.73_m2} (ref >59)

## 2020-07-12 LAB — VITAMIN D 25 HYDROXY (VIT D DEFICIENCY, FRACTURES): Vit D, 25-Hydroxy: 50.3 ng/mL (ref 30.0–100.0)

## 2020-07-12 LAB — TSH: TSH: 0.845 u[IU]/mL (ref 0.450–4.500)

## 2020-07-12 NOTE — Telephone Encounter (Signed)
Pt states mail order has been delivered to home. AS, CMA

## 2020-07-13 ENCOUNTER — Other Ambulatory Visit: Payer: Self-pay | Admitting: Physician Assistant

## 2020-07-13 DIAGNOSIS — G4700 Insomnia, unspecified: Secondary | ICD-10-CM

## 2020-07-13 DIAGNOSIS — F439 Reaction to severe stress, unspecified: Secondary | ICD-10-CM

## 2020-07-13 DIAGNOSIS — F411 Generalized anxiety disorder: Secondary | ICD-10-CM

## 2020-07-13 DIAGNOSIS — E785 Hyperlipidemia, unspecified: Secondary | ICD-10-CM

## 2020-07-20 ENCOUNTER — Encounter: Payer: Self-pay | Admitting: Physician Assistant

## 2020-08-27 ENCOUNTER — Encounter: Payer: Self-pay | Admitting: Physician Assistant

## 2020-09-25 ENCOUNTER — Other Ambulatory Visit: Payer: Self-pay | Admitting: Physician Assistant

## 2020-09-25 DIAGNOSIS — E785 Hyperlipidemia, unspecified: Secondary | ICD-10-CM

## 2020-10-10 ENCOUNTER — Other Ambulatory Visit: Payer: Self-pay | Admitting: Physician Assistant

## 2020-10-10 DIAGNOSIS — Z780 Asymptomatic menopausal state: Secondary | ICD-10-CM

## 2020-10-15 ENCOUNTER — Other Ambulatory Visit: Payer: 59

## 2020-11-19 ENCOUNTER — Encounter: Payer: Self-pay | Admitting: Physician Assistant

## 2020-11-27 ENCOUNTER — Other Ambulatory Visit: Payer: Self-pay

## 2020-11-27 DIAGNOSIS — E785 Hyperlipidemia, unspecified: Secondary | ICD-10-CM

## 2020-11-27 DIAGNOSIS — I159 Secondary hypertension, unspecified: Secondary | ICD-10-CM

## 2020-11-27 DIAGNOSIS — Z130 Encounter for screening for diseases of the blood and blood-forming organs and certain disorders involving the immune mechanism: Secondary | ICD-10-CM

## 2020-11-27 DIAGNOSIS — Z13228 Encounter for screening for other metabolic disorders: Secondary | ICD-10-CM

## 2020-11-28 ENCOUNTER — Encounter: Payer: 59 | Admitting: Physician Assistant

## 2020-11-28 ENCOUNTER — Other Ambulatory Visit: Payer: Self-pay

## 2020-11-28 ENCOUNTER — Other Ambulatory Visit: Payer: 59

## 2020-11-28 DIAGNOSIS — Z1321 Encounter for screening for nutritional disorder: Secondary | ICD-10-CM

## 2020-11-28 DIAGNOSIS — E785 Hyperlipidemia, unspecified: Secondary | ICD-10-CM

## 2020-11-28 DIAGNOSIS — Z130 Encounter for screening for diseases of the blood and blood-forming organs and certain disorders involving the immune mechanism: Secondary | ICD-10-CM

## 2020-11-28 DIAGNOSIS — I159 Secondary hypertension, unspecified: Secondary | ICD-10-CM

## 2020-11-29 LAB — LIPID PANEL
Chol/HDL Ratio: 3.2 ratio (ref 0.0–4.4)
Cholesterol, Total: 204 mg/dL — ABNORMAL HIGH (ref 100–199)
HDL: 63 mg/dL (ref >39)
LDL Chol Calc (NIH): 121 mg/dL — ABNORMAL HIGH (ref 0–99)
Triglycerides: 115 mg/dL (ref 0–149)
VLDL Cholesterol Cal: 20 mg/dL (ref 5–40)

## 2020-11-29 LAB — COMPREHENSIVE METABOLIC PANEL
ALT: 19 IU/L (ref 0–32)
AST: 26 IU/L (ref 0–40)
Albumin/Globulin Ratio: 2.2 (ref 1.2–2.2)
Albumin: 4.7 g/dL (ref 3.8–4.8)
Alkaline Phosphatase: 66 IU/L (ref 44–121)
BUN/Creatinine Ratio: 21 (ref 12–28)
BUN: 16 mg/dL (ref 8–27)
Bilirubin Total: 0.5 mg/dL (ref 0.0–1.2)
CO2: 24 mmol/L (ref 20–29)
Calcium: 9.1 mg/dL (ref 8.7–10.3)
Chloride: 103 mmol/L (ref 96–106)
Creatinine, Ser: 0.75 mg/dL (ref 0.57–1.00)
Globulin, Total: 2.1 g/dL (ref 1.5–4.5)
Glucose: 87 mg/dL (ref 65–99)
Potassium: 4 mmol/L (ref 3.5–5.2)
Sodium: 142 mmol/L (ref 134–144)
Total Protein: 6.8 g/dL (ref 6.0–8.5)
eGFR: 89 mL/min/{1.73_m2} (ref >59)

## 2020-11-29 LAB — CBC WITH DIFFERENTIAL/PLATELET
Basophils Absolute: 0 10*3/uL (ref 0.0–0.2)
Basos: 1 %
EOS (ABSOLUTE): 0.1 10*3/uL (ref 0.0–0.4)
Eos: 2 %
Hematocrit: 36.5 % (ref 34.0–46.6)
Hemoglobin: 12.1 g/dL (ref 11.1–15.9)
Immature Grans (Abs): 0 10*3/uL (ref 0.0–0.1)
Immature Granulocytes: 0 %
Lymphocytes Absolute: 1 10*3/uL (ref 0.7–3.1)
Lymphs: 18 %
MCH: 29.8 pg (ref 26.6–33.0)
MCHC: 33.2 g/dL (ref 31.5–35.7)
MCV: 90 fL (ref 79–97)
Monocytes Absolute: 0.4 10*3/uL (ref 0.1–0.9)
Monocytes: 7 %
Neutrophils Absolute: 3.9 10*3/uL (ref 1.4–7.0)
Neutrophils: 72 %
Platelets: 196 10*3/uL (ref 150–450)
RBC: 4.06 x10E6/uL (ref 3.77–5.28)
RDW: 12.3 % (ref 11.7–15.4)
WBC: 5.3 10*3/uL (ref 3.4–10.8)

## 2020-11-29 LAB — HEMOGLOBIN A1C
Est. average glucose Bld gHb Est-mCnc: 103 mg/dL
Hgb A1c MFr Bld: 5.2 % (ref 4.8–5.6)

## 2020-11-29 LAB — TSH: TSH: 1.54 u[IU]/mL (ref 0.450–4.500)

## 2020-12-03 ENCOUNTER — Other Ambulatory Visit: Payer: Self-pay

## 2020-12-03 ENCOUNTER — Ambulatory Visit (INDEPENDENT_AMBULATORY_CARE_PROVIDER_SITE_OTHER): Payer: 59 | Admitting: Physician Assistant

## 2020-12-03 ENCOUNTER — Other Ambulatory Visit: Payer: Self-pay | Admitting: Physician Assistant

## 2020-12-03 ENCOUNTER — Encounter: Payer: Self-pay | Admitting: Physician Assistant

## 2020-12-03 VITALS — BP 113/70 | HR 60 | Temp 98.3°F | Ht 69.0 in | Wt 157.0 lb

## 2020-12-03 DIAGNOSIS — Z0000 Encounter for general adult medical examination without abnormal findings: Secondary | ICD-10-CM | POA: Diagnosis not present

## 2020-12-03 DIAGNOSIS — F419 Anxiety disorder, unspecified: Secondary | ICD-10-CM

## 2020-12-03 DIAGNOSIS — E785 Hyperlipidemia, unspecified: Secondary | ICD-10-CM

## 2020-12-03 MED ORDER — ROSUVASTATIN CALCIUM 5 MG PO TABS: ORAL_TABLET | ORAL | 0 refills | Status: DC

## 2020-12-03 NOTE — Progress Notes (Signed)
Subjective:     Deborah Farmer is a 65 y.o. female and is here for a comprehensive physical exam. The patient reports no problems.    Social History   Socioeconomic History   Marital status: Married    Spouse name: Not on file   Number of children: Not on file   Years of education: Not on file   Highest education level: Not on file  Occupational History   Not on file  Tobacco Use   Smoking status: Never   Smokeless tobacco: Never  Vaping Use   Vaping Use: Never used  Substance and Sexual Activity   Alcohol use: Yes    Alcohol/week: 2.0 standard drinks    Types: 2 Glasses of wine per week   Drug use: Never   Sexual activity: Yes    Birth control/protection: None  Other Topics Concern   Not on file  Social History Narrative   Not on file   Social Determinants of Health   Financial Resource Strain: Not on file  Food Insecurity: Not on file  Transportation Needs: Not on file  Physical Activity: Not on file  Stress: Not on file  Social Connections: Not on file  Intimate Partner Violence: Not on file   Health Maintenance  Topic Date Due   COVID-19 Vaccine (4 - Booster for Pfizer series) 04/20/2020   INFLUENZA VACCINE  10/08/2020   PAP SMEAR-Modifier  03/24/2021   MAMMOGRAM  06/28/2022   TETANUS/TDAP  05/05/2026   COLONOSCOPY (Pts 45-51yrs Insurance coverage will need to be confirmed)  01/10/2027   Hepatitis C Screening  Completed   HIV Screening  Completed   Zoster Vaccines- Shingrix  Completed   HPV VACCINES  Aged Out    The following portions of the patient's history were reviewed and updated as appropriate: allergies, current medications, past family history, past medical history, past social history, past surgical history, and problem list.  Review of Systems Pertinent items noted in HPI and remainder of comprehensive ROS otherwise negative.   Objective:    BP 113/70   Pulse 60   Temp 98.3 F (36.8 C)   Ht 5\' 9"  (1.753 m)   Wt 157 lb (71.2 kg)   SpO2  100%   BMI 23.18 kg/m  General appearance: alert, cooperative, and no distress Head: Normocephalic, without obvious abnormality, atraumatic Eyes: conjunctivae/corneas clear. PERRL, EOM's intact. Fundi benign. Ears: normal TM and external ear canal left ear and abnormal TM right ear - serous middle ear fluid Nose: Nares normal. Septum midline. Mucosa normal. No drainage or sinus tenderness. Throat: lips, mucosa, and tongue normal; teeth and gums normal Neck: no adenopathy, no JVD, supple, symmetrical, trachea midline, and thyroid: normal to inspection and palpation Back: symmetric, no curvature. ROM normal. No CVA tenderness. Lungs: clear to auscultation bilaterally Abdomen: soft, non-tender; bowel sounds normal; no masses,  no organomegaly Extremities: extremities normal, atraumatic, no cyanosis or edema Pulses: 2+ and symmetric Skin: Skin color, texture, turgor normal. No rashes or lesions Lymph nodes: Cervical adenopathy: normal and Supraclavicular adenopathy: normal Neurologic: Grossly normal    Assessment:    Healthy female exam.    Plan:  -Discussed most recent labs which are essentially within normal limits or stable from prior. LDL remains elevated at 121, patient tolerating Crestor 3 times/wk. Has upcoming appointment with cardiology and will discuss Zetia or other treatment options. Recommend to continue with low fat diet and exercise regimen. The 10-year ASCVD risk score (Arnett DK, et al., 2019) is: 5% -Will  request OB/GYN records. -UTD mammogram and colonoscopy, scheduled for bone density 04/02/2021.  -Patient plans to obtain influenza and pneumococcal vaccines at the pharmacy. -Advised to adjust sleep schedule and avoid going to bed too early, if continues to wake up during the night then can trial low dose of melatonin. -Recommend to take oral antihistamine and/or nasal spray (Flonase) for fluid in right ear.  -Follow up in 6 months for HLD, mood  See After Visit  Summary for Counseling Recommendations

## 2020-12-03 NOTE — Patient Instructions (Signed)
Preventive Care 65-65 Years Old, Female Preventive care refers to lifestyle choices and visits with your health care provider that can promote health and wellness. This includes: A yearly physical exam. This is also called an annual wellness visit. Regular dental and eye exams. Immunizations. Screening for certain conditions. Healthy lifestyle choices, such as: Eating a healthy diet. Getting regular exercise. Not using drugs or products that contain nicotine and tobacco. Limiting alcohol use. What can I expect for my preventive care visit? Physical exam Your health care provider will check your: Height and weight. These may be used to calculate your BMI (body mass index). BMI is a measurement that tells if you are at a healthy weight. Heart rate and blood pressure. Body temperature. Skin for abnormal spots. Counseling Your health care provider may ask you questions about your: Past medical problems. Family's medical history. Alcohol, tobacco, and drug use. Emotional well-being. Home life and relationship well-being. Sexual activity. Diet, exercise, and sleep habits. Work and work environment. Access to firearms. Method of birth control. Menstrual cycle. Pregnancy history. What immunizations do I need? Vaccines are usually given at various ages, according to a schedule. Your health care provider will recommend vaccines for you based on your age, medical history, and lifestyle or other factors, such as travel or where you work. What tests do I need? Blood tests Lipid and cholesterol levels. These may be checked every 5 years, or more often if you are over 65 years old. Hepatitis C test. Hepatitis B test. Screening Lung cancer screening. You may have this screening every year starting at age 65 if you have a 30-pack-year history of smoking and currently smoke or have quit within the past 15 years. Colorectal cancer screening. All adults should have this screening starting at  age 65 and continuing until age 75. Your health care provider may recommend screening at age 45 if you are at increased risk. You will have tests every 1-10 years, depending on your results and the type of screening test. Diabetes screening. This is done by checking your blood sugar (glucose) after you have not eaten for a while (fasting). You may have this done every 1-3 years. Mammogram. This may be done every 1-2 years. Talk with your health care provider about when you should start having regular mammograms. This may depend on whether you have a family history of breast cancer. BRCA-related cancer screening. This may be done if you have a family history of breast, ovarian, tubal, or peritoneal cancers. Pelvic exam and Pap test. This may be done every 3 years starting at age 21. Starting at age 30, this may be done every 5 years if you have a Pap test in combination with an HPV test. Other tests STD (sexually transmitted disease) testing, if you are at risk. Bone density scan. This is done to screen for osteoporosis. You may have this scan if you are at high risk for osteoporosis. Talk with your health care provider about your test results, treatment options, and if necessary, the need for more tests. Follow these instructions at home: Eating and drinking  Eat a diet that includes fresh fruits and vegetables, whole grains, lean protein, and low-fat dairy products. Take vitamin and mineral supplements as recommended by your health care provider. Do not drink alcohol if: Your health care provider tells you not to drink. You are pregnant, may be pregnant, or are planning to become pregnant. If you drink alcohol: Limit how much you have to 0-1 drink a day. Be   aware of how much alcohol is in your drink. In the [place] one drink equals one 12 oz bottle of beer (355 mL), one 5 oz glass of wine (148 mL), or one 1 oz glass of hard liquor (44 mL). Lifestyle Take daily care of your teeth and  gums. Brush your teeth every morning and night with fluoride toothpaste. Floss one time each day. Stay active. Exercise for at least 30 minutes 5 or more days each week. Do not use any products that contain nicotine or tobacco, such as cigarettes, e-cigarettes, and chewing tobacco. If you need help quitting, ask your health care provider. Do not use drugs. If you are sexually active, practice safe sex. Use a condom or other form of protection to prevent STIs (sexually transmitted infections). If you do not wish to become pregnant, use a form of birth control. If you plan to become pregnant, see your health care provider for a prepregnancy visit. If told by your health care provider, take low-dose aspirin daily starting at age 65. Find healthy ways to cope with stress, such as: Meditation, yoga, or listening to music. Journaling. Talking to a trusted person. Spending time with friends and family. Safety Always wear your seat belt while driving or riding in a vehicle. Do not drive: If you have been drinking alcohol. Do not ride with someone who has been drinking. When you are tired or distracted. While texting. Wear a helmet and other protective equipment during sports activities. If you have firearms in your house, make sure you follow all gun safety procedures. What's next? Visit your health care provider once a year for an annual wellness visit. Ask your health care provider how often you should have your eyes and teeth checked. Stay up to date on all vaccines. This information is not intended to replace advice given to you by your health care provider. Make sure you discuss any questions you have with your health care provider. Document Revised: 05/04/2020 Document Reviewed: 11/05/2017 Elsevier Patient Education  2022 [employer].

## 2020-12-18 ENCOUNTER — Other Ambulatory Visit: Payer: Self-pay | Admitting: Physician Assistant

## 2020-12-18 DIAGNOSIS — I159 Secondary hypertension, unspecified: Secondary | ICD-10-CM

## 2020-12-18 DIAGNOSIS — I493 Ventricular premature depolarization: Secondary | ICD-10-CM | POA: Insufficient documentation

## 2021-02-12 ENCOUNTER — Other Ambulatory Visit: Payer: Self-pay | Admitting: Physician Assistant

## 2021-02-12 DIAGNOSIS — E785 Hyperlipidemia, unspecified: Secondary | ICD-10-CM

## 2021-02-12 MED ORDER — ROSUVASTATIN CALCIUM 5 MG PO TABS: ORAL_TABLET | ORAL | 0 refills | Status: DC

## 2021-03-10 HISTORY — PX: CATARACT EXTRACTION: SUR2

## 2021-03-20 ENCOUNTER — Encounter: Payer: Self-pay | Admitting: Physician Assistant

## 2021-03-20 ENCOUNTER — Other Ambulatory Visit: Payer: Self-pay

## 2021-03-20 DIAGNOSIS — G4700 Insomnia, unspecified: Secondary | ICD-10-CM

## 2021-03-20 DIAGNOSIS — F439 Reaction to severe stress, unspecified: Secondary | ICD-10-CM

## 2021-03-20 DIAGNOSIS — F411 Generalized anxiety disorder: Secondary | ICD-10-CM

## 2021-03-20 MED ORDER — ESCITALOPRAM OXALATE 10 MG PO TABS: ORAL_TABLET | ORAL | 0 refills | Status: DC

## 2021-03-21 ENCOUNTER — Ambulatory Visit
Admission: RE | Admit: 2021-03-21 | Discharge: 2021-03-21 | Disposition: A | Discharge status: Home / Self Care | Payer: Medicare Other | Source: Ambulatory Visit | Attending: Physician Assistant | Admitting: Physician Assistant

## 2021-03-21 DIAGNOSIS — Z780 Asymptomatic menopausal state: Secondary | ICD-10-CM

## 2021-04-02 ENCOUNTER — Other Ambulatory Visit: Payer: 59

## 2021-04-16 ENCOUNTER — Other Ambulatory Visit: Payer: Self-pay | Admitting: Physician Assistant

## 2021-04-16 DIAGNOSIS — Z1231 Encounter for screening mammogram for malignant neoplasm of breast: Secondary | ICD-10-CM

## 2021-05-27 ENCOUNTER — Encounter: Payer: Self-pay | Admitting: Physician Assistant

## 2021-05-27 ENCOUNTER — Ambulatory Visit (INDEPENDENT_AMBULATORY_CARE_PROVIDER_SITE_OTHER): Payer: Medicare Other | Admitting: Physician Assistant

## 2021-05-27 ENCOUNTER — Other Ambulatory Visit: Payer: Self-pay

## 2021-05-27 VITALS — BP 119/72 | HR 60 | Temp 97.9°F | Ht 69.0 in | Wt 158.0 lb

## 2021-05-27 DIAGNOSIS — G4700 Insomnia, unspecified: Secondary | ICD-10-CM | POA: Diagnosis not present

## 2021-05-27 DIAGNOSIS — F411 Generalized anxiety disorder: Secondary | ICD-10-CM

## 2021-05-27 DIAGNOSIS — E785 Hyperlipidemia, unspecified: Secondary | ICD-10-CM

## 2021-05-27 DIAGNOSIS — I480 Paroxysmal atrial fibrillation: Secondary | ICD-10-CM | POA: Diagnosis not present

## 2021-05-27 MED ORDER — EZETIMIBE 10 MG PO TABS: 10.0000 mg | ORAL_TABLET | Freq: Every day | ORAL | 0 refills | Status: DC

## 2021-05-27 NOTE — Assessment & Plan Note (Signed)
-  Improved. Recommend to continue with good sleep hygiene. ?

## 2021-05-27 NOTE — Assessment & Plan Note (Signed)
-  GAD-7 score of 2. Continue current medication regimen. Recommend to continue with exercise including yoga. Will continue to monitor. ?

## 2021-05-27 NOTE — Progress Notes (Signed)
?Established patient visit ? ? ?Patient: Deborah Farmer   DOB: 06/17/1955   66 y.o. Female  MRN: [id number] ?Visit Date: 05/27/2021 ? ?Chief Complaint  ?Patient presents with  ? Follow-up  ?  Mood  ? Hyperlipidemia  ? ?Subjective  ?  ?HPI ?HPI   ? ? Follow-up   ? Additional comments: Mood ? ?  ?  ?Last edited by Lupita Leash, CMA on 05/27/2021  9:13 AM.  ?  ?  ?Patient presents for follow up on mood and hypertension. ? ?Mood: Reports mood has been stable. Denies labile mood. Does report having more stress than usual and has felt some anxiety but nothing severe. No SI/HI. Taking 5 mg of Lexapro.  ? ?HLD: Pt taking Crestor 5 mg three times per week and states myalgias are tolerable. Has tried simvastatin in the past and did not tolerate well. Continues with exercise regimen of cardio and light weights. Continues with low fat diet. ? ?Insomnia: Reports sleep has improved. Going to bed at a later time. Feels like she is resting some better the past 2-3 weeks. ? ?PAF: Followed by cardiology. Taking medications as directed.  Occasionally will have brief flutters but nothing sustained. ? ?Depression screen [facility] 2/9 05/27/2021 12/03/2020 07/11/2020 03/13/2020 11/08/2019  ?Decreased Interest 0 0 0 0 0  ?Down, Depressed, Hopeless 0 0 0 0 0  ?PHQ - 2 Score 0 0 0 0 0  ?Altered sleeping 0 1 1 2  0  ?Tired, decreased energy 1 0 0 0 0  ?Change in appetite 0 0 0 0 0  ?Feeling bad or failure about yourself  0 0 0 0 0  ?Trouble concentrating 0 0 0 0 0  ?Moving slowly or fidgety/restless 0 0 0 0 0  ?Suicidal thoughts 0 0 0 0 0  ?PHQ-9 Score 1 1 1 2  0  ?Difficult doing work/chores Not difficult at all Not difficult at all - Not difficult at all -  ?Some recent data might be hidden  ? ?GAD 7 : Generalized Anxiety Score 05/27/2021 12/03/2020  ?Nervous, Anxious, on Edge 1 1  ?Control/stop worrying 0 0  ?Worry too much - different things 0 0  ?Trouble relaxing 1 1  ?Restless 0 0  ?Easily annoyed or irritable 0 0  ?Afraid - awful might happen 0 0   ?Total GAD 7 Score 2 2  ?Anxiety Difficulty Not difficult at all Not difficult at all  ? ? ?  ? ? ?Medications: ?Outpatient Medications Prior to Visit  ?Medication Sig  ? atenolol (TENORMIN) 25 MG tablet TAKE 1 TABLET (25 MG TOTAL) BY MOUTH DAILY.  ? escitalopram (LEXAPRO) 10 MG tablet TAKE 1/2 TABLET (5MG  TOTAL)DAILY  ? flecainide (TAMBOCOR) 100 MG tablet Take 1 tablet by mouth 2 (two) times daily.  ? Lactobacillus Rhamnosus, GG, (CULTURELLE) CAPS Take 1 capsule by mouth daily.  ? Multiple Vitamins-Minerals (MULTIVITAMIN ADULT PO) Take 1 tablet by mouth daily.  ? pantoprazole (PROTONIX) 40 MG tablet Take 40 mg by mouth daily.  ? rosuvastatin (CRESTOR) 5 MG tablet TAKE 1 TABLET ONCE DAILY ONMONDAY, WEDNESDAY, AND   FRIDAY (3 TIMES PER WEEK)  ? valACYclovir (VALTREX) 1000 MG tablet Take 2 tablets by mouth twice daily for 1 dose.  ? [DISCONTINUED] DEXILANT 60 MG capsule Take 1 capsule by mouth daily.  ? [DISCONTINUED] methocarbamol (ROBAXIN) 750 MG tablet Take 1 tablet (750 mg total) by mouth 3 (three) times daily as needed for muscle spasms.  ? ?No facility-administered medications prior to visit.  ? ? ?  Review of Systems ?Review of Systems:  ?A fourteen system review of systems was performed and found to be positive as per HPI. ? ? ?  Objective  ?  ?BP 119/72   Pulse 60   Temp 97.9 ?F (36.6 ?C)   Ht 5\' 9"  (1.753 m)   Wt 158 lb (71.7 kg)   SpO2 98%   BMI 23.33 kg/m?  ?BP Readings from Last 3 Encounters:  ?05/27/21 119/72  ?12/03/20 113/70  ?07/11/20 104/70  ? ?Wt Readings from Last 3 Encounters:  ?05/27/21 158 lb (71.7 kg)  ?12/03/20 157 lb (71.2 kg)  ?07/11/20 151 lb (68.5 kg)  ? ? ?Physical Exam  ?General:  Well Developed, well nourished, appropriate for stated age.  ?Neuro:  Alert and oriented,  extra-ocular muscles intact  ?HEENT:  Normocephalic, atraumatic, neck supple  ?Skin:  no gross rash, warm, pink. ?Cardiac:  RRR, S1 S2 ?Respiratory: CTA B/L  ?Vascular:  Ext warm, no cyanosis apprec.; cap RF less 2  sec. ?Psych:  No HI/SI, judgement and insight good, Euthymic mood. Full Affect. ? ? ?No results found for any visits on 05/27/21. ? Assessment & Plan  ?  ? ? ?Problem List Items Addressed This Visit   ? ?  ? Cardiovascular and Mediastinum  ? PAF (paroxysmal atrial fibrillation) (HCC)  ?  -Followed by Cardiology. ?-Reviewed last consult.  ?-On metoprolol and flecainide.  ?  ?  ? Relevant Medications  ? ezetimibe (ZETIA) 10 MG tablet  ?  ? Other  ? Hyperlipidemia (Chronic)  ?  -Last lipid panel: HDL 63, LDL 121 ?-Pt has hx of statin intolerance and overall tolerating Crestor three times per week. Discussed adding Zetia and is agreeable. Continue exercise regimen and heart healthy diet. Will repeat lipid panel and cmp in 6 weeks.  ?  ?  ? Relevant Medications  ? ezetimibe (ZETIA) 10 MG tablet  ? Insomnia  ?  -Improved. Recommend to continue with good sleep hygiene. ?  ?  ? GAD (generalized anxiety disorder) - Primary  ?  -GAD-7 score of 2. Continue current medication regimen. Recommend to continue with exercise including yoga. Will continue to monitor. ?  ?  ? ? ?Return in about 6 months (around 11/27/2021) for MCW and FBW; lab visit in 6 weeks for lipid panel and cmp.  ?   ? ? ? ?11/29/2021, PA-C  ?[facility] ?[phone] (phone) ?[phone] (fax) ? ?[facility] ?

## 2021-05-27 NOTE — Patient Instructions (Signed)
Osteopenia ?Osteopenia is a loss of thickness (density) inside the bones. Another name for osteopenia is low bone mass. Mild osteopenia is a normal part of aging. It is not a disease, and it does not cause symptoms. ?However, if you have osteopenia and continue to lose bone mass, you could develop a condition that causes the bones to become thin and break more easily (osteoporosis). Osteoporosis can cause you to lose some height, have back pain, and have a stooped posture. Although osteopenia is not a disease, making changes to your lifestyle and diet can help to prevent osteopenia from developing into osteoporosis. ?What are the causes? ?Osteopenia is caused by loss of calcium in the bones. Bones are constantly changing. Old bone cells are continually being replaced with new bone cells. This process builds new bone. ?The mineral calcium is needed to build new bone and maintain bone density. Bone density is usually highest around age 35. After that, most people's bodies cannot replace all the bone they have lost with new bone. ?What increases the risk? ?You are more likely to develop this condition if: ?You are older than age 50. ?You are a woman who went through menopause early. ?You have a long illness that keeps you in bed. ?You do not get enough exercise. ?You lack certain nutrients (malnutrition). ?You have an overactive thyroid gland (hyperthyroidism). ?You use products that contain nicotine or tobacco, such as cigarettes, e-cigarettes and chewing tobacco, or you drink a lot of alcohol. ?You are taking medicines that weaken the bones, such as steroids. ?What are the signs or symptoms? ?This condition does not cause any symptoms. You may have a slightly higher risk for bone breaks (fractures), so getting fractures more easily than normal may be an indication of osteopenia. ?How is this diagnosed? ?This condition may be diagnosed based on an X-ray exam that measures bone density (dual-energy X-ray  absorptiometry, or DEXA). This test can measure bone density in your hips, spine, and wrists. ?Osteopenia has no symptoms, so this condition is usually diagnosed after a routine bone density screening test is done for osteoporosis. This routine screening is usually done for: ?Women who are age 65 or older. ?Men who are age 70 or older. ?If you have risk factors for osteopenia, you may have the screening test at an earlier age. ?How is this treated? ?Making dietary and lifestyle changes can lower your risk for osteoporosis. ?If you have severe osteopenia that is close to becoming osteoporosis, this condition can be treated with medicines and dietary supplements such as calcium and vitamin D. These supplements help to rebuild bone density. ?Follow these instructions at home: ?Eating and drinking ?Eat a diet that is high in calcium and vitamin D. ?Calcium is found in dairy products, beans, salmon, and leafy green vegetables like spinach and broccoli. ?Look for foods that have vitamin D and calcium added to them (fortified foods), such as orange juice, cereal, and bread. ? ?Lifestyle ?Do 30 minutes or more of a weight-bearing exercise every day, such as walking, jogging, or playing a sport. These types of exercises strengthen the bones. ?Do not use any products that contain nicotine or tobacco, such as cigarettes, e-cigarettes, and chewing tobacco. If you need help quitting, ask your health care provider. ?Do not drink alcohol if: ?Your health care provider tells you not to drink. ?You are pregnant, may be pregnant, or are planning to become pregnant. ?If you drink alcohol: ?Limit how much you use to: ?0-1 drink a day for women. ?0-2 drinks   a day for men. ?Be aware of how much alcohol is in your drink. In the [place] one drink equals one 12 oz bottle of beer (355 mL), one 5 oz glass of wine (148 mL), or one 1? oz glass of hard liquor (44 mL). ?General instructions ?Take over-the-counter and prescription medicines only as  told by your health care provider. These include vitamins and supplements. ?Take precautions at home to lower your risk of falling, such as: ?Keeping rooms well-lit and free of clutter, such as cords. ?Installing safety rails on stairs. ?Using rubber mats in the bathroom or other areas that are often wet or slippery. ?Keep all follow-up visits. This is important. ?Contact a health care provider if: ?You have not had a bone density screening for osteoporosis and you are: ?A woman who is age 65 or older. ?A man who is age 70 or older. ?You are a postmenopausal woman who has not had a bone density screening for osteoporosis. ?You are older than age 50 and you want to know if you should have bone density screening for osteoporosis. ?Summary ?Osteopenia is a loss of thickness (density) inside the bones. Another name for osteopenia is low bone mass. ?Osteopenia is not a disease, but it may increase your risk for a condition that causes the bones to become thin and break more easily (osteoporosis). ?You may be at risk for osteopenia if you are older than age 50 or if you are a woman who went through early menopause. ?Osteopenia does not cause any symptoms, but it can be diagnosed with a bone density screening test. ?Dietary and lifestyle changes are the first treatment for osteopenia. These may lower your risk for osteoporosis. ?This information is not intended to replace advice given to you by your health care provider. Make sure you discuss any questions you have with your health care provider. ?Document Revised: 08/11/2019 Document Reviewed: 08/11/2019 ?Elsevier Patient Education ? 2022 [employer] ? ?

## 2021-05-27 NOTE — Assessment & Plan Note (Signed)
-  Last lipid panel: HDL 63, LDL 121 ?-Pt has hx of statin intolerance and overall tolerating Crestor three times per week. Discussed adding Zetia and is agreeable. Continue exercise regimen and heart healthy diet. Will repeat lipid panel and cmp in 6 weeks.  ?

## 2021-05-27 NOTE — Assessment & Plan Note (Signed)
-  Followed by Cardiology. ?-Reviewed last consult.  ?-On metoprolol and flecainide.  ?

## 2021-06-11 ENCOUNTER — Encounter: Payer: Self-pay | Admitting: Physician Assistant

## 2021-06-15 ENCOUNTER — Other Ambulatory Visit: Payer: Self-pay | Admitting: Physician Assistant

## 2021-06-15 DIAGNOSIS — F439 Reaction to severe stress, unspecified: Secondary | ICD-10-CM

## 2021-06-15 DIAGNOSIS — F411 Generalized anxiety disorder: Secondary | ICD-10-CM

## 2021-06-15 DIAGNOSIS — G4700 Insomnia, unspecified: Secondary | ICD-10-CM

## 2021-07-02 ENCOUNTER — Ambulatory Visit
Admission: RE | Admit: 2021-07-02 | Discharge: 2021-07-02 | Disposition: A | Discharge status: Home / Self Care | Payer: Medicare Other | Source: Ambulatory Visit | Attending: Physician Assistant | Admitting: Physician Assistant

## 2021-07-02 DIAGNOSIS — Z1231 Encounter for screening mammogram for malignant neoplasm of breast: Secondary | ICD-10-CM

## 2021-07-07 IMAGING — CR DG FOREARM 2V*R*
2 series · 2 of 2 positions shown · non-contrast
Comparison: None.

CLINICAL DATA: Forearm pain.

EXAM:
RIGHT FOREARM - 2 VIEW

[x forearm ap right]
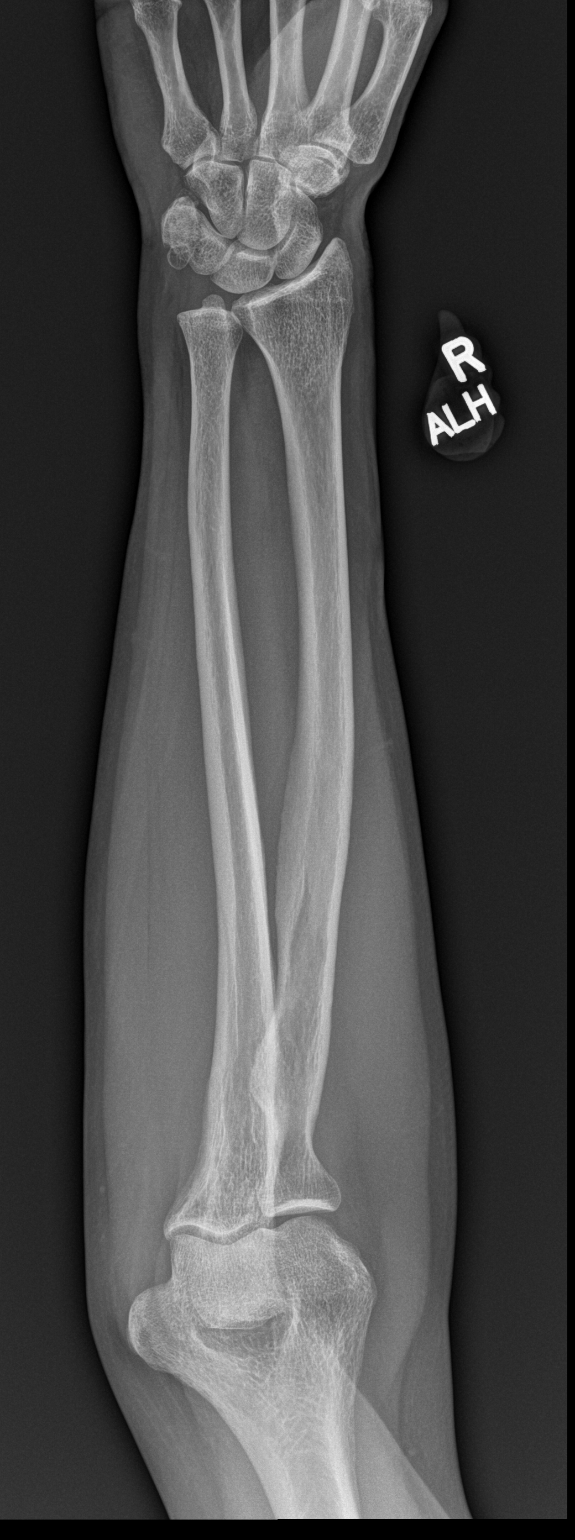

[x forearm lat right]
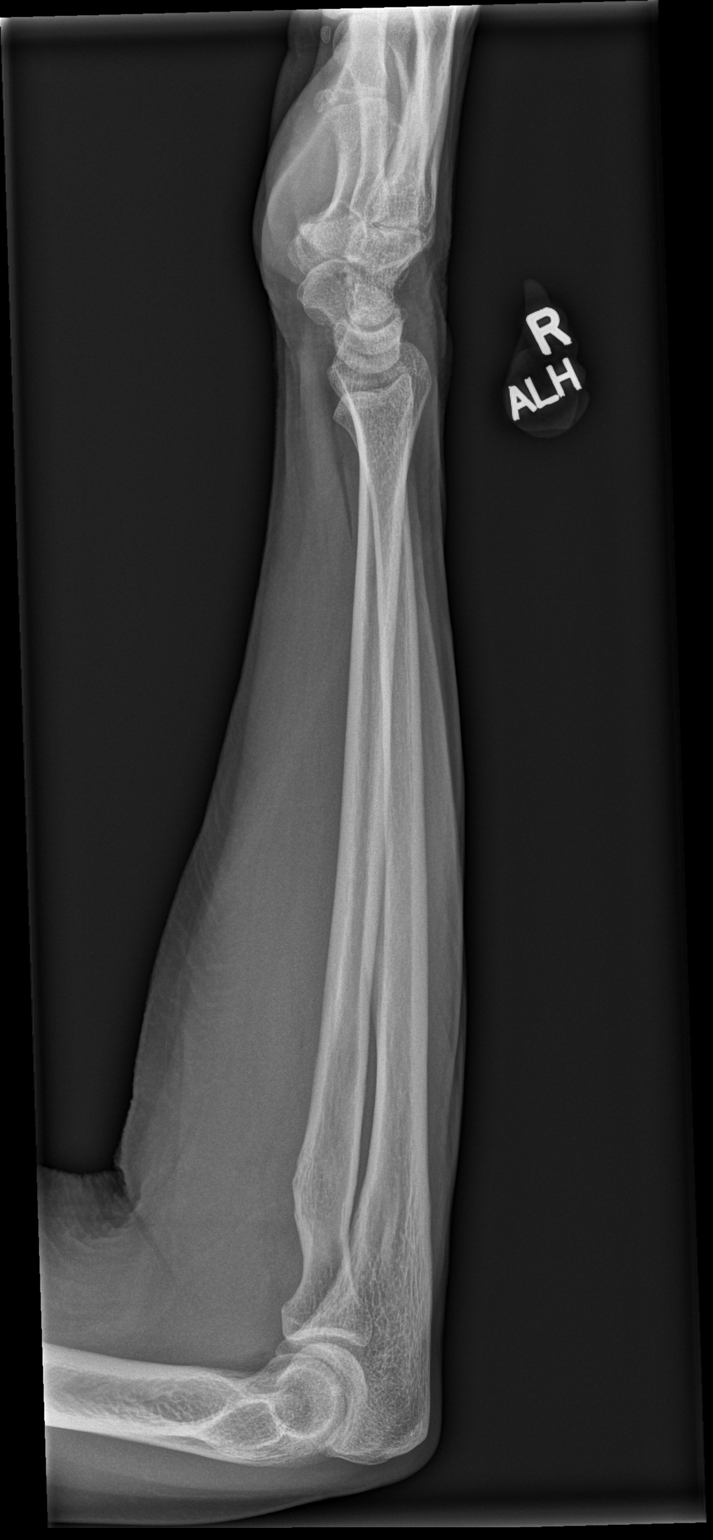

[2 of 2 positions shown; findings below may reference images not displayed]

FINDINGS: There is no evidence of fracture or other focal bone lesions. Soft
tissues are unremarkable.
IMPRESSION: Negative.

## 2021-07-15 ENCOUNTER — Encounter: Payer: Self-pay | Admitting: Physician Assistant

## 2021-07-16 ENCOUNTER — Ambulatory Visit (INDEPENDENT_AMBULATORY_CARE_PROVIDER_SITE_OTHER): Payer: Medicare Other | Admitting: Physician Assistant

## 2021-07-16 ENCOUNTER — Encounter: Payer: Self-pay | Admitting: Physician Assistant

## 2021-07-16 VITALS — BP 110/65 | HR 66 | Temp 97.7°F | Ht 69.0 in | Wt 155.0 lb

## 2021-07-16 DIAGNOSIS — M62830 Muscle spasm of back: Secondary | ICD-10-CM | POA: Diagnosis not present

## 2021-07-16 MED ORDER — METHOCARBAMOL 750 MG PO TABS
750.0000 mg | ORAL_TABLET | Freq: Three times a day (TID) | ORAL | 1 refills | Status: DC | PRN
Start: 2021-07-16 — End: 2021-11-27

## 2021-07-16 NOTE — Patient Instructions (Signed)
Acute Back Pain, Adult Acute back pain is sudden and usually short-lived. It is often caused by an injury to the muscles and tissues in the back. The injury may result from: A muscle, tendon, or ligament getting overstretched or torn. Ligaments are tissues that connect bones to each other. Lifting something improperly can cause a back strain. Wear and tear (degeneration) of the spinal disks. Spinal disks are circular tissue that provide cushioning between the bones of the spine (vertebrae). Twisting motions, such as while playing sports or doing yard work. A hit to the back. Arthritis. You may have a physical exam, lab tests, and imaging tests to find the cause of your pain. Acute back pain usually goes away with rest and home care. Follow these instructions at home: Managing pain, stiffness, and swelling Take over-the-counter and prescription medicines only as told by your health care provider. Treatment may include medicines for pain and inflammation that are taken by mouth or applied to the skin, or muscle relaxants. Your health care provider may recommend applying ice during the first 24-48 hours after your pain starts. To do this: Put ice in a plastic bag. Place a towel between your skin and the bag. Leave the ice on for 20 minutes, 2-3 times a day. Remove the ice if your skin turns bright red. This is very important. If you cannot feel pain, heat, or cold, you have a greater risk of damage to the area. If directed, apply heat to the affected area as often as told by your health care provider. Use the heat source that your health care provider recommends, such as a moist heat pack or a heating pad. Place a towel between your skin and the heat source. Leave the heat on for 20-30 minutes. Remove the heat if your skin turns bright red. This is especially important if you are unable to feel pain, heat, or cold. You have a greater risk of getting burned. Activity  Do not stay in bed. Staying in  bed for more than 1-2 days can delay your recovery. Sit up and stand up straight. Avoid leaning forward when you sit or hunching over when you stand. If you work at a desk, sit close to it so you do not need to lean over. Keep your chin tucked in. Keep your neck drawn back, and keep your elbows bent at a 90-degree angle (right angle). Sit high and close to the steering wheel when you drive. Add lower back (lumbar) support to your car seat, if needed. Take short walks on even surfaces as soon as you are able. Try to increase the length of time you walk each day. Do not sit, drive, or stand in one place for more than 30 minutes at a time. Sitting or standing for long periods of time can put stress on your back. Do not drive or use heavy machinery while taking prescription pain medicine. Use proper lifting techniques. When you bend and lift, use positions that put less stress on your back: Bend your knees. Keep the load close to your body. Avoid twisting. Exercise regularly as told by your health care provider. Exercising helps your back heal faster and helps prevent back injuries by keeping muscles strong and flexible. Work with a physical therapist to make a safe exercise program, as recommended by your health care provider. Do any exercises as told by your physical therapist. Lifestyle Maintain a healthy weight. Extra weight puts stress on your back and makes it difficult to have good   posture. Avoid activities or situations that make you feel anxious or stressed. Stress and anxiety increase muscle tension and can make back pain worse. Learn ways to manage anxiety and stress, such as through exercise. General instructions Sleep on a firm mattress in a comfortable position. Try lying on your side with your knees slightly bent. If you lie on your back, put a pillow under your knees. Keep your head and neck in a straight line with your spine (neutral position) when using electronic equipment like  smartphones or pads. To do this: Raise your smartphone or pad to look at it instead of bending your head or neck to look down. Put the smartphone or pad at the level of your face while looking at the screen. Follow your treatment plan as told by your health care provider. This may include: Cognitive or behavioral therapy. Acupuncture or massage therapy. Meditation or yoga. Contact a health care provider if: You have pain that is not relieved with rest or medicine. You have increasing pain going down into your legs or buttocks. Your pain does not improve after 2 weeks. You have pain at night. You lose weight without trying. You have a fever or chills. You develop nausea or vomiting. You develop abdominal pain. Get help right away if: You develop new bowel or bladder control problems. You have unusual weakness or numbness in your arms or legs. You feel faint. These symptoms may represent a serious problem that is an emergency. Do not wait to see if the symptoms will go away. Get medical help right away. Call your local emergency services (911 in the [place] Do not drive yourself to the hospital. Summary Acute back pain is sudden and usually short-lived. Use proper lifting techniques. When you bend and lift, use positions that put less stress on your back. Take over-the-counter and prescription medicines only as told by your health care provider, and apply heat or ice as told. This information is not intended to replace advice given to you by your health care provider. Make sure you discuss any questions you have with your health care provider. Document Revised: 05/18/2020 Document Reviewed: 05/18/2020 Elsevier Patient Education  2023 Elsevier Inc.  

## 2021-07-16 NOTE — Progress Notes (Signed)
?  Established patient acute visit ? ? ?Patient: Deborah Farmer   DOB: 1955-10-22   66 y.o. Female  MRN: [id number] ?Visit Date: 07/16/2021 ? ?No chief complaint on file. ? ?Subjective  ?  ?HPI  ?Patient presents with c/o acute muscle spasms of right mid-low back. Patient has hx of chronic back pain and recently was more active with bending and lifting with gardening. States last night had to take some Tylenol. States also has noticed burning sensation of both upper legs. Ran out of her muscle relaxer which she usually takes during a flare-up. No fever, bladder or bowel dysfunction. Continues with her gym routine and started Yoga about 1 month ago.  ? ? ? ?Medications: ?Outpatient Medications Prior to Visit  ?Medication Sig  ? atenolol (TENORMIN) 25 MG tablet TAKE 1 TABLET (25 MG TOTAL) BY MOUTH DAILY.  ? escitalopram (LEXAPRO) 10 MG tablet TAKE 1/2 TABLET(5 MG) BY MOUTH DAILY  ? ezetimibe (ZETIA) 10 MG tablet Take 1 tablet (10 mg total) by mouth daily.  ? flecainide (TAMBOCOR) 100 MG tablet Take 1 tablet by mouth 2 (two) times daily.  ? Lactobacillus Rhamnosus, GG, (CULTURELLE) CAPS Take 1 capsule by mouth daily.  ? Multiple Vitamins-Minerals (MULTIVITAMIN ADULT PO) Take 1 tablet by mouth daily.  ? pantoprazole (PROTONIX) 40 MG tablet Take 40 mg by mouth daily.  ? rosuvastatin (CRESTOR) 5 MG tablet TAKE 1 TABLET ONCE DAILY ONMONDAY, WEDNESDAY, AND   FRIDAY (3 TIMES PER WEEK)  ? valACYclovir (VALTREX) 1000 MG tablet Take 2 tablets by mouth twice daily for 1 dose.  ? ?No facility-administered medications prior to visit.  ? ? ?Review of Systems ?Review of Systems:  ?A fourteen system review of systems was performed and found to be positive as per HPI. ? ? ?  Objective  ?  ?BP 110/65   Pulse 66   Temp 97.7 ?F (36.5 ?C)   Ht 5\' 9"  (1.753 m)   Wt 155 lb (70.3 kg)   SpO2 98%   BMI 22.89 kg/m?  ? ? ?Physical Exam  ?General:  non-toxic appearing,  ?Neuro:  Alert and oriented,  extra-ocular muscles intact  ?HEENT:   Normocephalic, atraumatic, neck supple  ?Skin:  no gross rash, warm, pink. ?Cardiac:  RRR, S1 S2 ?Respiratory: CTA B/L  ?MSK: tenderness of paraspinal muscles, no step-off noted, discomfort with spinal flexion  ?Psych:  No HI/SI, judgement and insight good, Euthymic mood. Full Affect. ? ? ?No results found for any visits on 07/16/21. ? Assessment & Plan  ?  ? ?Patient with chronic back pain presenting with acute exacerbation like secondary to recent repetitive movement. Will provide refill of muscle relaxer. Advised to remain active and perform lighter activities until feeling better. Continue with ice therapy and topical lidocaine patch. Follow-up prn.  ? ? ?Return if symptoms worsen or fail to improve.  ?   ? ? ? ?Lorrene Reid, PA-C  ?[facility] ?[phone] (phone) ?[phone] (fax) ? ?[facility] ?

## 2021-07-23 ENCOUNTER — Other Ambulatory Visit: Payer: Self-pay | Admitting: Physician Assistant

## 2021-07-23 DIAGNOSIS — G4700 Insomnia, unspecified: Secondary | ICD-10-CM

## 2021-07-23 DIAGNOSIS — F439 Reaction to severe stress, unspecified: Secondary | ICD-10-CM

## 2021-07-23 DIAGNOSIS — F411 Generalized anxiety disorder: Secondary | ICD-10-CM

## 2021-07-27 ENCOUNTER — Other Ambulatory Visit: Payer: Self-pay | Admitting: Physician Assistant

## 2021-07-27 DIAGNOSIS — E785 Hyperlipidemia, unspecified: Secondary | ICD-10-CM

## 2021-08-08 ENCOUNTER — Other Ambulatory Visit: Payer: Self-pay | Admitting: Physician Assistant

## 2021-08-08 DIAGNOSIS — E785 Hyperlipidemia, unspecified: Secondary | ICD-10-CM

## 2021-10-08 ENCOUNTER — Other Ambulatory Visit: Payer: Self-pay | Admitting: Physician Assistant

## 2021-10-08 DIAGNOSIS — F411 Generalized anxiety disorder: Secondary | ICD-10-CM

## 2021-10-08 DIAGNOSIS — G4700 Insomnia, unspecified: Secondary | ICD-10-CM

## 2021-10-08 DIAGNOSIS — F439 Reaction to severe stress, unspecified: Secondary | ICD-10-CM

## 2021-10-09 MED ORDER — ESCITALOPRAM OXALATE 10 MG PO TABS: ORAL_TABLET | ORAL | 0 refills | Status: DC

## 2021-11-27 ENCOUNTER — Ambulatory Visit (INDEPENDENT_AMBULATORY_CARE_PROVIDER_SITE_OTHER): Payer: Medicare Other | Admitting: Physician Assistant

## 2021-11-27 ENCOUNTER — Encounter: Payer: Self-pay | Admitting: Physician Assistant

## 2021-11-27 VITALS — BP 117/75 | HR 54 | Temp 97.8°F | Ht 69.0 in | Wt 154.0 lb

## 2021-11-27 DIAGNOSIS — M62830 Muscle spasm of back: Secondary | ICD-10-CM

## 2021-11-27 DIAGNOSIS — Z780 Asymptomatic menopausal state: Secondary | ICD-10-CM

## 2021-11-27 DIAGNOSIS — E785 Hyperlipidemia, unspecified: Secondary | ICD-10-CM | POA: Diagnosis not present

## 2021-11-27 MED ORDER — METHOCARBAMOL 750 MG PO TABS: 750.0000 mg | ORAL_TABLET | Freq: Three times a day (TID) | ORAL | 1 refills | Status: DC | PRN

## 2021-11-27 NOTE — Assessment & Plan Note (Addendum)
-  Last lipid panel: HDL 63, LDL 121 -Patient with hx of some statin intolerance and advised to discuss with cardiology calcium score to help with risk stratification. Recommend to continue rosuvastatin 5 mg three times weekly. Advised to return tomorrow for fasting labs. Recommend to continue with exercise regimen and low fat diet.  -The 10-year ASCVD risk score (Arnett DK, et al., 2019) is: 6%

## 2021-11-27 NOTE — Progress Notes (Signed)
Established patient visit   Patient: Deborah Farmer   DOB: 08-17-1955   66 y.o. Female  MRN: [id number] Visit Date: 11/27/2021  Chief Complaint  Patient presents with   Follow-up   Subjective    HPI  Patient presents for chronic follow-up visit.   HLD: Pt reports overall tolerating rosuvastatin 5 mg three times weekly. Continues to exercise and follow a heart healthy diet.    Medications: Outpatient Medications Prior to Visit  Medication Sig   atenolol (TENORMIN) 25 MG tablet TAKE 1 TABLET (25 MG TOTAL) BY MOUTH DAILY.   escitalopram (LEXAPRO) 10 MG tablet Take 1/2 tablet by mouth daily   flecainide (TAMBOCOR) 100 MG tablet Take 1 tablet by mouth 2 (two) times daily.   Lactobacillus Rhamnosus, GG, (CULTURELLE) CAPS Take 1 capsule by mouth daily.   Melatonin 1 MG CHEW Chew 3 mg by mouth at bedtime.   Multiple Vitamins-Minerals (MULTIVITAMIN ADULT PO) Take 1 tablet by mouth daily.   pantoprazole (PROTONIX) 40 MG tablet Take 40 mg by mouth daily.   rosuvastatin (CRESTOR) 5 MG tablet TAKE 1 TABLET ONCE DAILY ON MONDAY, WEDNESDAY, AND FRIDAY   valACYclovir (VALTREX) 1000 MG tablet Take 2 tablets by mouth twice daily for 1 dose.   [DISCONTINUED] methocarbamol (ROBAXIN) 750 MG tablet Take 1 tablet (750 mg total) by mouth every 8 (eight) hours as needed for muscle spasms.   No facility-administered medications prior to visit.    Review of Systems Review of Systems:  A fourteen system review of systems was performed and found to be positive as per HPI.   Last CBC Lab Results  Component Value Date   WBC 5.3 11/28/2020   HGB 12.1 11/28/2020   HCT 36.5 11/28/2020   MCV 90 11/28/2020   MCH 29.8 11/28/2020   RDW 12.3 11/28/2020   PLT 196 38/88/2800   Last metabolic panel Lab Results  Component Value Date   GLUCOSE 87 11/28/2020   NA 142 11/28/2020   K 4.0 11/28/2020   CL 103 11/28/2020   CO2 24 11/28/2020   BUN 16 11/28/2020   CREATININE 0.75 11/28/2020   EGFR 89  11/28/2020   CALCIUM 9.1 11/28/2020   PROT 6.8 11/28/2020   ALBUMIN 4.7 11/28/2020   LABGLOB 2.1 11/28/2020   AGRATIO 2.2 11/28/2020   BILITOT 0.5 11/28/2020   ALKPHOS 66 11/28/2020   AST 26 11/28/2020   ALT 19 11/28/2020   Last lipids Lab Results  Component Value Date   CHOL 204 (H) 11/28/2020   HDL 63 11/28/2020   LDLCALC 121 (H) 11/28/2020   LDLDIRECT 127 (H) 03/13/2020   TRIG 115 11/28/2020   CHOLHDL 3.2 11/28/2020   Last hemoglobin A1c Lab Results  Component Value Date   HGBA1C 5.2 11/28/2020   Last thyroid functions Lab Results  Component Value Date   TSH 1.540 11/28/2020   Last vitamin D Lab Results  Component Value Date   VD25OH 50.3 07/11/2020       Objective    BP 117/75   Pulse (!) 54   Temp 97.8 F (36.6 C) (Temporal)   Ht '5\' 9"'  (1.753 m)   Wt 154 lb (69.9 kg)   BMI 22.74 kg/m  BP Readings from Last 3 Encounters:  11/27/21 117/75  07/16/21 110/65  05/27/21 119/72   Wt Readings from Last 3 Encounters:  11/27/21 154 lb (69.9 kg)  07/16/21 155 lb (70.3 kg)  05/27/21 158 lb (71.7 kg)    Physical Exam  General:  Well  Developed, well nourished, appropriate for stated age.  Neuro:  Alert and oriented,  extra-ocular muscles intact  HEENT:  Normocephalic, atraumatic, neck supple  Skin:  no gross rash, warm, pink. Cardiac:  RRR, S1 S2 Respiratory: CTA B/L  Vascular:  Ext warm, no cyanosis apprec.; cap RF less 2 sec. Psych:  No HI/SI, judgement and insight good, Euthymic mood. Full Affect.   No results found for any visits on 11/27/21.  Assessment & Plan      Problem List Items Addressed This Visit       Other   Hyperlipidemia - Primary (Chronic)    -Last lipid panel: HDL 63, LDL 121 -Patient with hx of some statin intolerance and advised to discuss with cardiology calcium score to help with risk stratification. Recommend to continue rosuvastatin 5 mg three times weekly. Advised to return tomorrow for fasting labs. Recommend to continue  with exercise regimen and low fat diet.  -The 10-year ASCVD risk score (Arnett DK, et al., 2019) is: 6%       Other Visit Diagnoses     Back spasm       Relevant Medications   methocarbamol (ROBAXIN) 750 MG tablet      Provided refill of methocarbamol to take as needed for back pain/spasms. Recommend to continue with exercise.  Return for Welcome to Medicare next Monday/Tuesday; lab visit for [facility] tomorrow.        Lorrene Reid, PA-C  [facility] at [facility] [phone] (phone) [phone] (fax)  [place]

## 2021-11-28 ENCOUNTER — Other Ambulatory Visit: Payer: Medicare Other

## 2021-11-28 DIAGNOSIS — E785 Hyperlipidemia, unspecified: Secondary | ICD-10-CM

## 2021-11-29 LAB — CBC WITH DIFFERENTIAL/PLATELET
Basophils Absolute: 0 10*3/uL (ref 0.0–0.2)
Basos: 1 %
EOS (ABSOLUTE): 0.1 10*3/uL (ref 0.0–0.4)
Eos: 1 %
Hematocrit: 35.9 % (ref 34.0–46.6)
Hemoglobin: 12.1 g/dL (ref 11.1–15.9)
Immature Grans (Abs): 0 10*3/uL (ref 0.0–0.1)
Immature Granulocytes: 0 %
Lymphocytes Absolute: 1.2 10*3/uL (ref 0.7–3.1)
Lymphs: 29 %
MCH: 30.5 pg (ref 26.6–33.0)
MCHC: 33.7 g/dL (ref 31.5–35.7)
MCV: 90 fL (ref 79–97)
Monocytes Absolute: 0.3 10*3/uL (ref 0.1–0.9)
Monocytes: 7 %
Neutrophils Absolute: 2.7 10*3/uL (ref 1.4–7.0)
Neutrophils: 62 %
Platelets: 209 10*3/uL (ref 150–450)
RBC: 3.97 x10E6/uL (ref 3.77–5.28)
RDW: 12.2 % (ref 11.7–15.4)
WBC: 4.2 10*3/uL (ref 3.4–10.8)

## 2021-11-29 LAB — COMPREHENSIVE METABOLIC PANEL
ALT: 23 IU/L (ref 0–32)
AST: 31 IU/L (ref 0–40)
Albumin/Globulin Ratio: 1.8 (ref 1.2–2.2)
Albumin: 4.6 g/dL (ref 3.9–4.9)
Alkaline Phosphatase: 67 IU/L (ref 44–121)
BUN/Creatinine Ratio: 18 (ref 12–28)
BUN: 14 mg/dL (ref 8–27)
Bilirubin Total: 0.4 mg/dL (ref 0.0–1.2)
CO2: 26 mmol/L (ref 20–29)
Calcium: 9.5 mg/dL (ref 8.7–10.3)
Chloride: 107 mmol/L — ABNORMAL HIGH (ref 96–106)
Creatinine, Ser: 0.78 mg/dL (ref 0.57–1.00)
Globulin, Total: 2.5 g/dL (ref 1.5–4.5)
Glucose: 93 mg/dL (ref 70–99)
Potassium: 5 mmol/L (ref 3.5–5.2)
Sodium: 145 mmol/L — ABNORMAL HIGH (ref 134–144)
Total Protein: 7.1 g/dL (ref 6.0–8.5)
eGFR: 84 mL/min/{1.73_m2} (ref >59)

## 2021-11-29 LAB — LIPID PANEL
Chol/HDL Ratio: 3.2 ratio (ref 0.0–4.4)
Cholesterol, Total: 193 mg/dL (ref 100–199)
HDL: 61 mg/dL (ref >39)
LDL Chol Calc (NIH): 119 mg/dL — ABNORMAL HIGH (ref 0–99)
Triglycerides: 68 mg/dL (ref 0–149)
VLDL Cholesterol Cal: 13 mg/dL (ref 5–40)

## 2021-12-03 ENCOUNTER — Encounter: Payer: Medicare Other | Admitting: Physician Assistant

## 2022-01-07 ENCOUNTER — Other Ambulatory Visit: Payer: Self-pay | Admitting: Physician Assistant

## 2022-01-07 DIAGNOSIS — G4700 Insomnia, unspecified: Secondary | ICD-10-CM

## 2022-01-07 DIAGNOSIS — F439 Reaction to severe stress, unspecified: Secondary | ICD-10-CM

## 2022-01-07 DIAGNOSIS — F411 Generalized anxiety disorder: Secondary | ICD-10-CM

## 2022-01-14 ENCOUNTER — Encounter: Payer: Self-pay | Admitting: Physician Assistant

## 2022-01-14 ENCOUNTER — Ambulatory Visit (INDEPENDENT_AMBULATORY_CARE_PROVIDER_SITE_OTHER): Payer: Medicare Other | Admitting: Physician Assistant

## 2022-01-14 VITALS — BP 119/77 | HR 62 | Resp 18 | Ht 69.0 in | Wt 156.1 lb

## 2022-01-14 DIAGNOSIS — R109 Unspecified abdominal pain: Secondary | ICD-10-CM

## 2022-01-14 DIAGNOSIS — R319 Hematuria, unspecified: Secondary | ICD-10-CM

## 2022-01-14 DIAGNOSIS — R102 Pelvic and perineal pain: Secondary | ICD-10-CM

## 2022-01-14 DIAGNOSIS — R1031 Right lower quadrant pain: Secondary | ICD-10-CM

## 2022-01-14 LAB — POCT URINALYSIS DIPSTICK
Bilirubin, UA: NEGATIVE
Glucose, UA: NEGATIVE
Ketones, UA: NEGATIVE
Leukocytes, UA: NEGATIVE
Nitrite, UA: NEGATIVE
Protein, UA: NEGATIVE
Spec Grav, UA: >=1.03 — AB (ref 1.010–1.025)
Urobilinogen, UA: 0.2 E.U./dL
pH, UA: 6 (ref 5.0–8.0)

## 2022-01-14 NOTE — Progress Notes (Signed)
Established patient acute visit   Patient: Deborah Farmer   DOB: [birth year]   66 y.o. Female  MRN: [id number] Visit Date: 01/14/2022  Chief Complaint  Patient presents with   Abdominal Pain    Right lower    Subjective    HPI HPI     Abdominal Pain    Additional comments: Right lower       Last edited by Gemma Payor, CMA on 01/14/2022 11:02 AM.      Patient presents with c/o pelvic pressure x 2 week which is mostly on the right. One night did have a pulsating pain in right groin area which radiated to right flank area. Tylenol and Ibuprofen don't seem to help much. Sometimes feels like needs to pass something. No dysuria, hematuria, cloudy urine, vaginal discharge or fever. Does report 1 episode of postcoital vaginal bleeding, no reoccurrence.     Medications: Outpatient Medications Prior to Visit  Medication Sig   atenolol (TENORMIN) 25 MG tablet TAKE 1 TABLET (25 MG TOTAL) BY MOUTH DAILY.   escitalopram (LEXAPRO) 10 MG tablet TAKE 1/2 TABLET BY MOUTH DAILY   flecainide (TAMBOCOR) 100 MG tablet Take 1 tablet by mouth 2 (two) times daily.   Lactobacillus Rhamnosus, GG, (CULTURELLE) CAPS Take 1 capsule by mouth daily.   Melatonin 1 MG CHEW Chew 3 mg by mouth at bedtime.   methocarbamol (ROBAXIN) 750 MG tablet Take 1 tablet (750 mg total) by mouth every 8 (eight) hours as needed for muscle spasms.   Multiple Vitamins-Minerals (MULTIVITAMIN ADULT PO) Take 1 tablet by mouth daily.   pantoprazole (PROTONIX) 40 MG tablet Take 40 mg by mouth daily.   rosuvastatin (CRESTOR) 5 MG tablet TAKE 1 TABLET ONCE DAILY ON MONDAY, WEDNESDAY, AND FRIDAY   valACYclovir (VALTREX) 1000 MG tablet Take 2 tablets by mouth twice daily for 1 dose.   No facility-administered medications prior to visit.    Review of Systems Review of Systems:  A fourteen system review of systems was performed and found to be positive as per HPI.  Last CBC Lab Results  Component Value Date   WBC 4.2  11/28/2021   HGB 12.1 11/28/2021   HCT 35.9 11/28/2021   MCV 90 11/28/2021   MCH 30.5 11/28/2021   RDW 12.2 11/28/2021   PLT 209 95/62/1308   Last metabolic panel Lab Results  Component Value Date   GLUCOSE 93 11/28/2021   NA 145 (H) 11/28/2021   K 5.0 11/28/2021   CL 107 (H) 11/28/2021   CO2 26 11/28/2021   BUN 14 11/28/2021   CREATININE 0.78 11/28/2021   EGFR 84 11/28/2021   CALCIUM 9.5 11/28/2021   PROT 7.1 11/28/2021   ALBUMIN 4.6 11/28/2021   LABGLOB 2.5 11/28/2021   AGRATIO 1.8 11/28/2021   BILITOT 0.4 11/28/2021   ALKPHOS 67 11/28/2021   AST 31 11/28/2021   ALT 23 11/28/2021   Last lipids Lab Results  Component Value Date   CHOL 193 11/28/2021   HDL 61 11/28/2021   LDLCALC 119 (H) 11/28/2021   LDLDIRECT 127 (H) 03/13/2020   TRIG 68 11/28/2021   CHOLHDL 3.2 11/28/2021   Last hemoglobin A1c Lab Results  Component Value Date   HGBA1C 5.2 11/28/2020   Last thyroid functions Lab Results  Component Value Date   TSH 1.540 11/28/2020   Last vitamin D Lab Results  Component Value Date   VD25OH 50.3 07/11/2020       Objective    BP 119/77 (BP Location:  Left Arm, Patient Position: Sitting, Cuff Size: Normal)   Pulse 62   Resp 18   Ht _0  (1.753 m)   Wt 156 lb 1.3 oz (70.8 kg)   SpO2 97%   BMI 23.05 kg/m    Physical Exam  General:  Well Developed, well nourished, appropriate for stated age.  Neuro:  Alert and oriented,  extra-ocular muscles intact  HEENT:  Normocephalic, atraumatic, neck supple  Skin:  no gross rash, warm, pink. Abdomen: tenderness over suprapubic region and discomfort over right groin, +BS, non-distended, no CVA tenderness Respiratory: Speaking in full sentences, unlabored. Vascular:  Ext warm, no cyanosis apprec.; cap RF less 2 sec. Psych:  No HI/SI, judgement and insight good, Euthymic mood. Full Affect.   Results for orders placed or performed in visit on 01/14/22  POCT Urinalysis Dipstick  Result Value Ref Range    Color, UA yellow    Clarity, UA clear    Glucose, UA Negative Negative   Bilirubin, UA Negative    Ketones, UA Negative    Spec Grav, UA >=1.030 (A) 1.010 - 1.025   Blood, UA Trace-intact    pH, UA 6.0 5.0 - 8.0   Protein, UA Negative Negative   Urobilinogen, UA 0.2 0.2 or 1.0 E.U./dL   Nitrite, UA Negative    Leukocytes, UA Negative Negative   Appearance     Odor      Assessment & Plan     Discussed with patient etiology of symptoms unclear but suspect kidney stone. UA collected and essentially unremarkable with exception of hematuria. Will place order for abdomen pelvis CT w/o contrast to evaluate for nephrolithiasis. Discussed to continue with adequate hydration. Will follow-up pending imaging results. Discussed postcoital bleeding likely secondary to postmenopausal vaginal atrophy. Recommend to monitor for re-occurring symptoms.   Return if symptoms worsen or fail to improve.        Lorrene Reid, PA-C  [facility] at [facility] [phone] (phone) [phone] (fax)  [place]

## 2022-01-16 ENCOUNTER — Ambulatory Visit
Admission: RE | Admit: 2022-01-16 | Discharge: 2022-01-16 | Disposition: A | Discharge status: Home / Self Care | Payer: Medicare Other | Source: Ambulatory Visit | Attending: Physician Assistant | Admitting: Physician Assistant

## 2022-01-16 ENCOUNTER — Telehealth: Payer: Self-pay

## 2022-01-16 DIAGNOSIS — R1031 Right lower quadrant pain: Secondary | ICD-10-CM

## 2022-01-16 DIAGNOSIS — R109 Unspecified abdominal pain: Secondary | ICD-10-CM

## 2022-01-16 DIAGNOSIS — R319 Hematuria, unspecified: Secondary | ICD-10-CM

## 2022-01-16 NOTE — Telephone Encounter (Signed)
I spoke with Medicare representative Ines Bloomer who states that patient does not require a prior authorization for CT scan as long as it is medically necessary and ordered by a provider.

## 2022-01-21 ENCOUNTER — Telehealth: Payer: Self-pay | Admitting: *Deleted

## 2022-01-21 DIAGNOSIS — K4090 Unilateral inguinal hernia, without obstruction or gangrene, not specified as recurrent: Secondary | ICD-10-CM

## 2022-01-21 NOTE — Telephone Encounter (Signed)
Patient has been informed that the referral has been placed and that once it has been processed that department will reach out to her for scheduling. Patient verbalized understanding. All questions and concerns have been addressed.

## 2022-01-21 NOTE — Telephone Encounter (Signed)
Pt called in reference to the imaging she had completed. I informed her of the message from provider and also pasted that information into her MyChart.  She wanted to see if you could go ahead and put in referral for the consult at general surgery.Trevionne Advani Zimmerman Rumple, CMA

## 2022-01-23 ENCOUNTER — Other Ambulatory Visit: Payer: Self-pay | Admitting: Physician Assistant

## 2022-01-23 DIAGNOSIS — E785 Hyperlipidemia, unspecified: Secondary | ICD-10-CM

## 2022-02-03 ENCOUNTER — Telehealth: Payer: Self-pay

## 2022-02-03 DIAGNOSIS — B002 Herpesviral gingivostomatitis and pharyngotonsillitis: Secondary | ICD-10-CM

## 2022-02-03 MED ORDER — VALACYCLOVIR HCL 1 G PO TABS: ORAL_TABLET | ORAL | 0 refills | Status: AC

## 2022-02-03 NOTE — Telephone Encounter (Signed)
Pt is requesting refill: valACYclovir (VALTREX) 1000 MG tablet   Pharmacy: [id number] - [place], [place] - [address] AT [facility] [address]   LOV 01/14/22

## 2022-02-03 NOTE — Telephone Encounter (Signed)
Refill has been sent to [facility]

## 2022-02-17 ENCOUNTER — Other Ambulatory Visit: Payer: Self-pay | Admitting: Surgery

## 2022-03-17 ENCOUNTER — Other Ambulatory Visit: Payer: Self-pay | Admitting: Physician Assistant

## 2022-03-17 DIAGNOSIS — Z1231 Encounter for screening mammogram for malignant neoplasm of breast: Secondary | ICD-10-CM

## 2022-04-22 ENCOUNTER — Other Ambulatory Visit: Payer: Self-pay

## 2022-04-22 DIAGNOSIS — G4700 Insomnia, unspecified: Secondary | ICD-10-CM

## 2022-04-22 DIAGNOSIS — F439 Reaction to severe stress, unspecified: Secondary | ICD-10-CM

## 2022-04-22 DIAGNOSIS — F411 Generalized anxiety disorder: Secondary | ICD-10-CM

## 2022-04-22 MED ORDER — ESCITALOPRAM OXALATE 10 MG PO TABS: 5.0000 mg | ORAL_TABLET | Freq: Every day | ORAL | 0 refills | Status: DC

## 2022-04-22 NOTE — Telephone Encounter (Signed)
L.O.V: 01/14/22  N.O.V: 05/13/22  L.R.F: 01/07/22 Escitalopram 45 tab 0 refill   30 day sent

## 2022-05-13 ENCOUNTER — Ambulatory Visit (INDEPENDENT_AMBULATORY_CARE_PROVIDER_SITE_OTHER): Payer: Medicare Other | Admitting: Family Medicine

## 2022-05-13 ENCOUNTER — Encounter: Payer: Self-pay | Admitting: Family Medicine

## 2022-05-13 VITALS — BP 121/78 | HR 62 | Resp 18 | Ht 69.0 in | Wt 155.0 lb

## 2022-05-13 DIAGNOSIS — E785 Hyperlipidemia, unspecified: Secondary | ICD-10-CM | POA: Diagnosis not present

## 2022-05-13 DIAGNOSIS — M5416 Radiculopathy, lumbar region: Secondary | ICD-10-CM

## 2022-05-13 DIAGNOSIS — I159 Secondary hypertension, unspecified: Secondary | ICD-10-CM | POA: Diagnosis not present

## 2022-05-13 DIAGNOSIS — Z0000 Encounter for general adult medical examination without abnormal findings: Secondary | ICD-10-CM | POA: Diagnosis not present

## 2022-05-13 DIAGNOSIS — F411 Generalized anxiety disorder: Secondary | ICD-10-CM

## 2022-05-13 DIAGNOSIS — Z789 Other specified health status: Secondary | ICD-10-CM

## 2022-05-13 NOTE — Progress Notes (Signed)
Subjective:   Deborah Farmer is a 67 y.o. female who presents for Medicare Annual (Subsequent) preventive examination. 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  She does yoga every week which does help with the sciatica.  She has noticed a change in her back pain where it is now radiating across her flank and anteriorly to her groin.  She is wondering if she should see orthopedics again or if there are any other options. Also of note she took 2 doses of Robaxin for her back pain first thing in the morning and again at lunch yesterday.  Afterward, she was having palpitations that felt very similar to when she has had a fibrillation in the past.  She took her blood pressure medication before she went to bed and woke up this morning with symptoms resolved.  We will continue to keep an eye on it, and she will let either myself or cardiology know if it happens again.  Review of Systems    Review of Systems  Musculoskeletal:  Positive for back pain.  All other systems reviewed and are negative.   Cardiac Risk Factors include: none     Objective:    Today's Vitals   05/13/22 1610  BP: 121/78  Pulse: 62  Resp: 18  SpO2: 100%  Weight: 155 lb (70.3 kg)  Height: '5\' 9"'$  (1.753 m)  PainSc: 6   PainLoc: Back   Body mass index is 22.89 kg/m.     04/27/2018    2:07 PM  Advanced Directives  Does Patient Have a Medical Advance Directive? Yes  Type of Paramedic of [place];Living will    Current Medications (verified) Outpatient Encounter Medications as of 05/13/2022  Medication Sig   atenolol (TENORMIN) 25 MG tablet TAKE 1 TABLET (25 MG TOTAL) BY MOUTH DAILY.   escitalopram (LEXAPRO) 10 MG tablet Take 0.5 tablets (5 mg total) by mouth daily.   flecainide (TAMBOCOR) 100 MG tablet Take 1 tablet by mouth 2 (two) times daily.   Lactobacillus Rhamnosus, GG, (CULTURELLE) CAPS  Take 1 capsule by mouth daily.   methocarbamol (ROBAXIN) 750 MG tablet Take 1 tablet (750 mg total) by mouth every 8 (eight) hours as needed for muscle spasms.   Multiple Vitamins-Minerals (MULTIVITAMIN ADULT PO) Take 1 tablet by mouth daily.   pantoprazole (PROTONIX) 40 MG tablet Take 40 mg by mouth daily.   rosuvastatin (CRESTOR) 5 MG tablet TAKE 1 TABLET BY MOUTH THREE DAYS A WEEK. ON MONDAY, WEDNESDAY, AND FRIDAY   valACYclovir (VALTREX) 1000 MG tablet Take 2 tablets by mouth twice daily for 1 dose.   [DISCONTINUED] Melatonin 1 MG CHEW Chew 3 mg by mouth at bedtime.   No facility-administered encounter medications on file as of 05/13/2022.    Allergies (verified) Patient has no known allergies.   History: Past Medical History:  Diagnosis Date   Atrial fibrillation ([place])    Hyperlipidemia    Past Surgical History:  Procedure Laterality Date   AUGMENTATION MAMMAPLASTY Bilateral 2006   BREAST BIOPSY Left 1997   BREAST SURGERY     biopsy and augmentation   CATARACT EXTRACTION Bilateral 03/2021   Family History  Problem Relation Age of Onset   Heart attack Mother    Hypertension Mother    Congestive Heart Failure Mother    Heart attack Father    Diabetes Sister    Breast cancer Paternal Grandmother  Diabetes Brother    Social History   Socioeconomic History   Marital status: Married    Spouse name: Not on file   Number of children: Not on file   Years of education: Not on file   Highest education level: Not on file  Occupational History   Not on file  Tobacco Use   Smoking status: Never   Smokeless tobacco: Never  Vaping Use   Vaping Use: Never used  Substance and Sexual Activity   Alcohol use: Yes    Alcohol/week: 2.0 standard drinks of alcohol    Types: 2 Glasses of wine per week   Drug use: Never   Sexual activity: Yes    Birth control/protection: None  Other Topics Concern   Not on file  Social History Narrative   Not on file   Social Determinants  of Health   Financial Resource Strain: Low Risk  (05/13/2022)   Overall Financial Resource Strain (CARDIA)    Difficulty of Paying Living Expenses: Not hard at all  Food Insecurity: No Food Insecurity (05/13/2022)   Hunger Vital Sign    Worried About Running Out of Food in the Last Year: Never true    Ran Out of Food in the Last Year: Never true  Transportation Needs: No Transportation Needs (05/13/2022)   PRAPARE - Hydrologist (Medical): No    Lack of Transportation (Non-Medical): No  Physical Activity: Sufficiently Active (05/13/2022)   Exercise Vital Sign    Days of Exercise per Week: 4 days    Minutes of Exercise per Session: 60 min  Stress: No Stress Concern Present (05/13/2022)   [place]    Feeling of Stress : Not at all  Social Connections: Moderately Isolated (05/13/2022)   Social Connection and Isolation Panel [NHANES]    Frequency of Communication with Friends and Family: Twice a week    Frequency of Social Gatherings with Friends and Family: Twice a week    Attends Religious Services: Never    Marine scientist or Organizations: No    Attends Music therapist: Never    Marital Status: Married    Tobacco Counseling Counseling given: Not Answered   Clinical Intake:     Pain Score: 6         How often do you need to have someone help you when you read instructions, pamphlets, or other written materials from your doctor or pharmacy?: (P) 1 - Never  Diabetic? NO         Activities of Daily Living    05/13/2022    4:22 PM 05/11/2022    3:54 PM  In your present state of health, do you have any difficulty performing the following activities:  Hearing? 0   Vision? 0 0  Difficulty concentrating or making decisions? 0 0  Walking or climbing stairs? 0 0  Dressing or bathing? 0 0  Doing errands, shopping? 0 0  Preparing Food and eating ? N N  Using the  Toilet? N N  In the past six months, have you accidently leaked urine? Y Y  Do you have problems with loss of bowel control? N N  Managing your Medications? N N  Managing your Finances? N N  Housekeeping or managing your Housekeeping? N N    Patient Care Team: Velva Harman, PA as PCP - General (Family Medicine) Jalene Mullet, MD as Referring Physician (Obstetrics and Gynecology)  Indicate  any recent Medical Services you may have received from other than [facility] providers in the past year (date may be approximate).     Assessment:   This is a routine wellness examination for Deborah Farmer.  Hearing/Vision screen Vision Screening - Comments:: Followed by [facility]   Annette Stable issues and exercise activities discussed: Current Exercise Habits: Home exercise routine, Time (Minutes): 60, Frequency (Times/Week): 4, Weekly Exercise (Minutes/Week): 240, Intensity: Moderate, Exercise limited by: None identified   Goals Addressed   None   Depression Screen    05/13/2022    4:21 PM 01/14/2022   11:04 AM 11/27/2021    9:18 AM 05/27/2021    9:15 AM 12/03/2020   11:06 AM 07/11/2020   10:09 AM 03/13/2020    9:58 AM  PHQ 2/9 Scores  PHQ - 2 Score 0 0 0 0 0 0 0  PHQ- 9 Score  '1 1 1 1 1 2    '$ Fall Risk    05/13/2022    4:24 PM 05/11/2022    3:54 PM 01/14/2022   11:05 AM 11/27/2021    9:17 AM 07/16/2021    4:00 PM  [place] in the past year? 0 0 0 0 0  Number falls in past yr: 0  0 0 0  Injury with Fall? 0 0 0 0 0  Risk for fall due to :    No Fall Risks No Fall Risks  Follow up    Falls evaluation completed Falls evaluation completed    [place]:  Any stairs in or around the home? Yes  If so, are there any without handrails? No  Home free of loose throw rugs in walkways, pet beds, electrical cords, etc? Yes  Adequate lighting in your home to reduce risk of falls? Yes   ASSISTIVE DEVICES UTILIZED TO PREVENT FALLS:  Life alert? No  Use of a cane,  walker or w/c? No  Grab bars in the bathroom? No  Shower chair or bench in shower? Yes  Elevated toilet seat or a handicapped toilet? No   TIMED UP AND GO:  Was the test performed? Yes .  Length of time to ambulate 10 feet: 10-15 sec.   Gait steady and fast without use of assistive device  Cognitive Function:        05/13/2022    3:50 PM  6CIT Screen  What Year? 0 points  What month? 0 points  What time? 0 points  Count back from 20 0 points  Months in reverse 0 points  Repeat phrase 0 points  Total Score 0 points    Immunizations Immunization History  Administered Date(s) Administered   Influenza, Quadrivalent, Recombinant, Inj, Pf 12/07/2018   Influenza-Unspecified 01/18/2020, 12/27/2020, 12/27/2021   PFIZER(Purple Top)SARS-COV-2 Vaccination 04/23/2019, 05/17/2019, 01/27/2020   PNEUMOCOCCAL CONJUGATE-20 12/27/2020   Pfizer Covid-19 Vaccine Bivalent Booster 7yr & up 11/27/2020   Tdap 05/05/2016   Zoster Recombinat (Shingrix) 10/18/2018, 03/16/2019   Zoster, Live 01/03/2016    TDAP status: Up to date  Flu Vaccine status: Up to date  Pneumococcal vaccine status: Up to date  Covid-19 vaccine status: Information provided on how to obtain vaccines.   Qualifies for Shingles Vaccine? Yes   Zostavax completed Yes   Shingrix Completed?: Yes  Screening Tests Health Maintenance  Topic Date Due   COVID-19 Vaccine (5 - 2023-24 season) 11/08/2021   Medicare Annual Wellness (AWV)  05/13/2023   MAMMOGRAM  07/03/2023   DTaP/Tdap/Td (2 -  Td or Tdap) 05/05/2026   COLONOSCOPY (Pts 45-64yr Insurance coverage will need to be confirmed)  01/10/2027   Pneumonia Vaccine 67 Years old  Completed   INFLUENZA VACCINE  Completed   DEXA SCAN  Completed   Hepatitis C Screening  Completed   Zoster Vaccines- Shingrix  Completed   HPV VACCINES  Aged Out    Health Maintenance  Health Maintenance Due  Topic Date Due   COVID-19 Vaccine (5 - 2023-24 season) 11/08/2021     Colorectal cancer screening: Type of screening: Colonoscopy. Completed 01/09/2014. Repeat every 10 years  Mammogram status: Completed 07/02/2021. Repeat every year  Bone Density status: Completed 03/21/2021. Results reflect: Bone density results: OSTEOPENIA. Repeat every 3 years.  Lung Cancer Screening: (Low Dose CT Chest recommended if Age 974-80years, 30 pack-year currently smoking OR have quit w/in 15years.) does not qualify.     Additional Screening:  Hepatitis C Screening: does qualify; declined  Vision Screening: Recommended annual ophthalmology exams for early detection of glaucoma and other disorders of the eye. Is the patient up to date with their annual eye exam?  Yes  Who is the provider or what is the name of the office in which the patient attends annual eye exams? [facility]. If pt is not established with a provider, would they like to be referred to a provider to establish care?  Established .   Dental Screening: Recommended annual dental exams for proper oral hygiene  Community Resource Referral / Chronic Care Management: CRR required this visit?  No   CCM required this visit?  No      Plan:    We will collect CBC, CMP, lipid panel when she is fasting sometime in the next couple of weeks.  Will follow-up in 3 months for her cholesterol levels and to discuss potential changes to her regimen if necessary.  Sent in a referral to neurology to discuss management options for her radiculopathy secondary to her spinal stenosis.  I have personally reviewed and noted the following in the patient's chart:   Medical and social history Use of alcohol, tobacco or illicit drugs  Current medications and supplements including opioid prescriptions. Patient is not currently taking opioid prescriptions. Functional ability and status Nutritional status Physical activity Advanced directives List of other physicians Hospitalizations, surgeries, and ER visits in previous 12  months Vitals Screenings to include cognitive, depression, and falls Referrals and appointments  In addition, I have reviewed and discussed with patient certain preventive protocols, quality metrics, and best practice recommendations. A written personalized care plan for preventive services as well as general preventive health recommendations were provided to patient.     MVelva Harman PA   05/13/2022   Nurse Notes: Face to face 20 min

## 2022-05-21 NOTE — Addendum Note (Signed)
Addended by: Virgil Benedict on: 05/21/2022 12:21 PM   Modules accepted: Orders

## 2022-05-26 ENCOUNTER — Encounter: Payer: Self-pay | Admitting: Family Medicine

## 2022-05-26 DIAGNOSIS — E785 Hyperlipidemia, unspecified: Secondary | ICD-10-CM

## 2022-06-04 ENCOUNTER — Other Ambulatory Visit: Payer: Medicare Other

## 2022-06-04 DIAGNOSIS — E785 Hyperlipidemia, unspecified: Secondary | ICD-10-CM

## 2022-06-04 DIAGNOSIS — I159 Secondary hypertension, unspecified: Secondary | ICD-10-CM

## 2022-06-05 ENCOUNTER — Other Ambulatory Visit: Payer: Self-pay | Admitting: Family Medicine

## 2022-06-05 DIAGNOSIS — E785 Hyperlipidemia, unspecified: Secondary | ICD-10-CM

## 2022-06-05 LAB — LIPID PANEL
Chol/HDL Ratio: 3.7 ratio (ref 0.0–4.4)
Cholesterol, Total: 234 mg/dL — ABNORMAL HIGH (ref 100–199)
HDL: 64 mg/dL (ref >39)
LDL Chol Calc (NIH): 151 mg/dL — ABNORMAL HIGH (ref 0–99)
Triglycerides: 110 mg/dL (ref 0–149)
VLDL Cholesterol Cal: 19 mg/dL (ref 5–40)

## 2022-06-05 LAB — CBC WITH DIFFERENTIAL/PLATELET
Basophils Absolute: 0 10*3/uL (ref 0.0–0.2)
Basos: 1 %
EOS (ABSOLUTE): 0.1 10*3/uL (ref 0.0–0.4)
Eos: 2 %
Hematocrit: 40.5 % (ref 34.0–46.6)
Hemoglobin: 13.1 g/dL (ref 11.1–15.9)
Immature Grans (Abs): 0 10*3/uL (ref 0.0–0.1)
Immature Granulocytes: 0 %
Lymphocytes Absolute: 1.3 10*3/uL (ref 0.7–3.1)
Lymphs: 31 %
MCH: 30.1 pg (ref 26.6–33.0)
MCHC: 32.3 g/dL (ref 31.5–35.7)
MCV: 93 fL (ref 79–97)
Monocytes Absolute: 0.3 10*3/uL (ref 0.1–0.9)
Monocytes: 7 %
Neutrophils Absolute: 2.4 10*3/uL (ref 1.4–7.0)
Neutrophils: 59 %
Platelets: 220 10*3/uL (ref 150–450)
RBC: 4.35 x10E6/uL (ref 3.77–5.28)
RDW: 12.3 % (ref 11.7–15.4)
WBC: 4.1 10*3/uL (ref 3.4–10.8)

## 2022-06-05 LAB — COMPREHENSIVE METABOLIC PANEL
ALT: 19 IU/L (ref 0–32)
AST: 27 IU/L (ref 0–40)
Albumin/Globulin Ratio: 1.8 (ref 1.2–2.2)
Albumin: 4.8 g/dL (ref 3.9–4.9)
Alkaline Phosphatase: 68 IU/L (ref 44–121)
BUN/Creatinine Ratio: 20 (ref 12–28)
BUN: 16 mg/dL (ref 8–27)
Bilirubin Total: 0.6 mg/dL (ref 0.0–1.2)
CO2: 26 mmol/L (ref 20–29)
Calcium: 9.6 mg/dL (ref 8.7–10.3)
Chloride: 102 mmol/L (ref 96–106)
Creatinine, Ser: 0.81 mg/dL (ref 0.57–1.00)
Globulin, Total: 2.6 g/dL (ref 1.5–4.5)
Glucose: 96 mg/dL (ref 70–99)
Potassium: 4.4 mmol/L (ref 3.5–5.2)
Sodium: 143 mmol/L (ref 134–144)
Total Protein: 7.4 g/dL (ref 6.0–8.5)
eGFR: 80 mL/min/{1.73_m2} (ref >59)

## 2022-06-05 MED ORDER — ROSUVASTATIN CALCIUM 5 MG PO TABS: 5.0000 mg | ORAL_TABLET | Freq: Every day | ORAL | 0 refills | Status: DC

## 2022-06-05 MED ORDER — ROSUVASTATIN CALCIUM 10 MG PO TABS: 10.0000 mg | ORAL_TABLET | Freq: Every day | ORAL | 1 refills | Status: DC

## 2022-06-30 MED ORDER — ROSUVASTATIN CALCIUM 5 MG PO TABS: 5.0000 mg | ORAL_TABLET | Freq: Every day | ORAL | 0 refills | Status: DC

## 2022-06-30 NOTE — Addendum Note (Signed)
Addended by: Tonny Bollman on: 06/30/2022 01:17 PM   Modules accepted: Orders

## 2022-07-08 ENCOUNTER — Ambulatory Visit
Admission: RE | Admit: 2022-07-08 | Discharge: 2022-07-08 | Disposition: A | Discharge status: Home / Self Care | Payer: Medicare Other | Source: Ambulatory Visit

## 2022-07-08 DIAGNOSIS — Z1231 Encounter for screening mammogram for malignant neoplasm of breast: Secondary | ICD-10-CM

## 2022-07-22 ENCOUNTER — Other Ambulatory Visit: Payer: Self-pay

## 2022-07-22 DIAGNOSIS — F411 Generalized anxiety disorder: Secondary | ICD-10-CM

## 2022-07-22 DIAGNOSIS — G4700 Insomnia, unspecified: Secondary | ICD-10-CM

## 2022-07-22 DIAGNOSIS — F439 Reaction to severe stress, unspecified: Secondary | ICD-10-CM

## 2022-07-22 MED ORDER — ESCITALOPRAM OXALATE 10 MG PO TABS: 5.0000 mg | ORAL_TABLET | Freq: Every day | ORAL | 0 refills | Status: DC

## 2022-08-07 ENCOUNTER — Ambulatory Visit (INDEPENDENT_AMBULATORY_CARE_PROVIDER_SITE_OTHER): Payer: Medicare Other | Admitting: Family Medicine

## 2022-08-07 ENCOUNTER — Encounter: Payer: Self-pay | Admitting: Family Medicine

## 2022-08-07 VITALS — BP 142/84 | HR 61 | Temp 98.7°F | Ht 69.0 in | Wt 155.0 lb

## 2022-08-07 DIAGNOSIS — R052 Subacute cough: Secondary | ICD-10-CM | POA: Diagnosis not present

## 2022-08-07 MED ORDER — BENZONATATE 100 MG PO CAPS: 100.0000 mg | ORAL_CAPSULE | Freq: Two times a day (BID) | ORAL | 0 refills | Status: DC | PRN

## 2022-08-07 MED ORDER — GUAIFENESIN ER 600 MG PO TB12: 600.0000 mg | ORAL_TABLET | Freq: Two times a day (BID) | ORAL | 1 refills | Status: DC | PRN

## 2022-08-07 MED ORDER — DOXYCYCLINE HYCLATE 100 MG PO TABS: 100.0000 mg | ORAL_TABLET | Freq: Two times a day (BID) | ORAL | 0 refills | Status: AC

## 2022-08-07 MED ORDER — AMOXICILLIN-POT CLAVULANATE 875-125 MG PO TABS: 1.0000 | ORAL_TABLET | Freq: Two times a day (BID) | ORAL | 0 refills | Status: AC

## 2022-08-07 NOTE — Assessment & Plan Note (Signed)
Would favor bronchitis of viral origin as most likely cause of her cough, but cannot rule out a pneumonia and given her atrial fibrillation and the risk associated with untreated pneumonia, will treat with 5 days of Augmentin and doxycycline.  Prescribed guaifenesin p.o. twice daily.

## 2022-08-07 NOTE — Patient Instructions (Signed)
It was nice to meet you today,  I am going to treat you for possible pneumonia given your persistent cough and your pre-existing atrial fibrillation.  I prescribed doxycycline and Augmentin for 5 days.  You take both twice a day.  I have also prescribed 2 medications to help with your cough.  The Tessalon is a cough suppressant.  It may or may not work for you since you have tried it in the past without success.  The other 1 is a guaifenesin tablet.  The same ingredient from Mucinex.  You can use this instead of your Mucinex.  It is a mucolytic.  Follow-up with [place] as needed.  Have a great day,  Frederic Jericho, MD

## 2022-08-07 NOTE — Progress Notes (Signed)
   Acute Office Visit  Subjective:     Patient ID: Deborah Farmer, female    DOB: Dec 25, 1955, 67 y.o.   MRN: [id number]  Chief Complaint  Patient presents with   Cough    HPI  Cough and congestion-patient states her symptoms started on the ninth of this month.  Started with a slight sore throat that then progressed into sinus congestion and sinus pressure with thick green mucus.  She use Mucinex to help relief this.  Eventually the mucus became more clear.  She also then started develop ear pain and felt like the symptoms moved into her throat and she developed a dry raspy cough.  Now feels like majority of her symptoms are in her "chest".  Cough is worse at night and feels like it is "choking her".  She had a productive cough over the weekend but other than that has been dry.  Denies any fevers.  No one else around her has been sick.  No history of allergies.  Does have a history of A-fib for which she takes flecainide and atenolol.   ROS      Objective:    BP (!) 142/84   Pulse 61   Temp 98.7 F (37.1 C) (Oral)   Ht 5\' 9"  (1.753 m)   Wt 155 lb (70.3 kg)   SpO2 97%   BMI 22.89 kg/m    Physical Exam General: Alert, oriented CV: Irregularly irregular rhythm Pulmonary: Coughing.  No wheezes or crackles or rhonchi.  No respiratory distress.  No tachypnea.  No results found for any visits on 08/07/22.      Assessment & Plan:   Problem List Items Addressed This Visit       Other   Subacute cough - Primary    Would favor bronchitis of viral origin as most likely cause of her cough, but cannot rule out a pneumonia and given her atrial fibrillation and the risk associated with untreated pneumonia, will treat with 5 days of Augmentin and doxycycline.  Prescribed guaifenesin p.o. twice daily.        Meds ordered this encounter  Medications   amoxicillin-clavulanate (AUGMENTIN) 875-125 MG tablet    Sig: Take 1 tablet by mouth 2 (two) times daily for 5 days.    Dispense:   10 tablet    Refill:  0   guaiFENesin (MUCINEX) 600 MG 12 hr tablet    Sig: Take 1 tablet (600 mg total) by mouth 2 (two) times daily as needed for cough.    Dispense:  30 tablet    Refill:  1   benzonatate (TESSALON) 100 MG capsule    Sig: Take 1 capsule (100 mg total) by mouth 2 (two) times daily as needed for cough.    Dispense:  20 capsule    Refill:  0   doxycycline (VIBRA-TABS) 100 MG tablet    Sig: Take 1 tablet (100 mg total) by mouth 2 (two) times daily for 5 days.    Dispense:  10 tablet    Refill:  0    Return if symptoms worsen or fail to improve.  Sandre Kitty, MD

## 2022-08-12 ENCOUNTER — Encounter: Payer: Self-pay | Admitting: Family Medicine

## 2022-08-12 ENCOUNTER — Ambulatory Visit (INDEPENDENT_AMBULATORY_CARE_PROVIDER_SITE_OTHER): Payer: Medicare Other | Admitting: Family Medicine

## 2022-08-12 VITALS — BP 119/73 | HR 56 | Resp 18 | Ht 69.0 in | Wt 154.0 lb

## 2022-08-12 DIAGNOSIS — E785 Hyperlipidemia, unspecified: Secondary | ICD-10-CM

## 2022-08-12 NOTE — Assessment & Plan Note (Signed)
Last lipid panel: LDL 151, HDL 64, triglycerides 110.  Recommended taking rosuvastatin 5 mg daily instead of 3 times weekly, rechecking lipid panel today.  Continue with physical activity and low-fat diet.  Continue rosuvastatin 5 mg daily unless lab results warrant change, we will be cautious with any increases in medication due to history of myalgias when initiating rosuvastatin.

## 2022-08-12 NOTE — Progress Notes (Signed)
   Established Patient Office Visit  Subjective   Patient ID: Deborah Farmer, female    DOB: 09-16-1955  Age: 67 y.o. MRN: [id number]  Chief Complaint  Patient presents with   Hyperlipidemia    HPI Deborah Farmer is a 67 y.o. female presenting today for follow up of hyperlipidemia. Hyperlipidemia: tolerating rosuvastatin well with no myalgias or significant side effects.  Since her last appointment, she increased her rosuvastatin 5 mg from 3 days a week to 7 days a week.  Currently consuming a low fat diet.  She does yoga few times a week and goes to the gym at least once a week to stay active. The 10-year ASCVD risk score (Arnett DK, et al., 2019) is: 7.3%  ROS Negative unless otherwise noted in HPI   Objective:     BP 119/73 (BP Location: Left Arm, Patient Position: Sitting, Cuff Size: Normal)   Pulse (!) 56   Resp 18   Ht 5\' 9"  (1.753 m)   Wt 154 lb (69.9 kg)   SpO2 98%   BMI 22.74 kg/m   Physical Exam Constitutional:      General: She is not in acute distress.    Appearance: Normal appearance.  HENT:     Head: Normocephalic and atraumatic.  Cardiovascular:     Rate and Rhythm: Normal rate and regular rhythm.     Heart sounds: No murmur heard.    No friction rub. No gallop.  Pulmonary:     Effort: Pulmonary effort is normal. No respiratory distress.     Breath sounds: No wheezing, rhonchi or rales.  Skin:    General: Skin is warm and dry.  Neurological:     Mental Status: She is alert and oriented to person, place, and time.     Assessment & Plan:  Hyperlipidemia, unspecified hyperlipidemia type Assessment & Plan: Last lipid panel: LDL 151, HDL 64, triglycerides 110.  Recommended taking rosuvastatin 5 mg daily instead of 3 times weekly, rechecking lipid panel today.  Continue with physical activity and low-fat diet.  Continue rosuvastatin 5 mg daily unless lab results warrant change, we will be cautious with any increases in medication due to history of myalgias  when initiating rosuvastatin.  Orders: -     Lipid panel; Future    Return in about 1 day (around 08/13/2022) for fasting blood work, follow-up in 6 months for HLD with fasting blood work 1 week before.    Melida Quitter, PA

## 2022-08-13 ENCOUNTER — Ambulatory Visit: Payer: Medicare Other | Admitting: Family Medicine

## 2022-08-20 ENCOUNTER — Other Ambulatory Visit: Payer: Medicare Other

## 2022-08-21 ENCOUNTER — Other Ambulatory Visit: Payer: Medicare Other

## 2022-08-21 DIAGNOSIS — E785 Hyperlipidemia, unspecified: Secondary | ICD-10-CM

## 2022-08-21 DIAGNOSIS — Z0000 Encounter for general adult medical examination without abnormal findings: Secondary | ICD-10-CM

## 2022-08-21 DIAGNOSIS — I159 Secondary hypertension, unspecified: Secondary | ICD-10-CM

## 2022-08-22 LAB — LIPID PANEL
Chol/HDL Ratio: 3.4 ratio (ref 0.0–4.4)
Cholesterol, Total: 186 mg/dL (ref 100–199)
HDL: 55 mg/dL (ref >39)
LDL Chol Calc (NIH): 112 mg/dL — ABNORMAL HIGH (ref 0–99)
Triglycerides: 103 mg/dL (ref 0–149)
VLDL Cholesterol Cal: 19 mg/dL (ref 5–40)

## 2022-08-22 LAB — COMPREHENSIVE METABOLIC PANEL
ALT: 30 IU/L (ref 0–32)
AST: 33 IU/L (ref 0–40)
Albumin/Globulin Ratio: 2
Albumin: 4.5 g/dL (ref 3.9–4.9)
Alkaline Phosphatase: 71 IU/L (ref 44–121)
BUN/Creatinine Ratio: 14 (ref 12–28)
BUN: 11 mg/dL (ref 8–27)
Bilirubin Total: 0.9 mg/dL (ref 0.0–1.2)
CO2: 27 mmol/L (ref 20–29)
Calcium: 9.2 mg/dL (ref 8.7–10.3)
Chloride: 103 mmol/L (ref 96–106)
Creatinine, Ser: 0.78 mg/dL (ref 0.57–1.00)
Globulin, Total: 2.2 g/dL (ref 1.5–4.5)
Glucose: 86 mg/dL (ref 70–99)
Potassium: 4.4 mmol/L (ref 3.5–5.2)
Sodium: 142 mmol/L (ref 134–144)
Total Protein: 6.7 g/dL (ref 6.0–8.5)
eGFR: 84 mL/min/{1.73_m2} (ref >59)

## 2022-09-24 ENCOUNTER — Other Ambulatory Visit: Payer: Self-pay | Admitting: Family Medicine

## 2022-09-24 DIAGNOSIS — G4700 Insomnia, unspecified: Secondary | ICD-10-CM

## 2022-09-24 DIAGNOSIS — F439 Reaction to severe stress, unspecified: Secondary | ICD-10-CM

## 2022-09-24 DIAGNOSIS — F411 Generalized anxiety disorder: Secondary | ICD-10-CM

## 2022-09-28 ENCOUNTER — Other Ambulatory Visit: Payer: Self-pay | Admitting: Family Medicine

## 2022-09-28 DIAGNOSIS — E785 Hyperlipidemia, unspecified: Secondary | ICD-10-CM

## 2022-10-01 ENCOUNTER — Other Ambulatory Visit: Payer: Self-pay | Admitting: Family Medicine

## 2022-10-01 DIAGNOSIS — E785 Hyperlipidemia, unspecified: Secondary | ICD-10-CM

## 2022-12-22 ENCOUNTER — Other Ambulatory Visit: Payer: Self-pay

## 2022-12-22 DIAGNOSIS — E785 Hyperlipidemia, unspecified: Secondary | ICD-10-CM

## 2022-12-22 MED ORDER — ROSUVASTATIN CALCIUM 5 MG PO TABS: 5.0000 mg | ORAL_TABLET | Freq: Every day | ORAL | 0 refills | Status: DC

## 2023-01-22 ENCOUNTER — Other Ambulatory Visit: Payer: Self-pay | Admitting: Family Medicine

## 2023-01-22 DIAGNOSIS — F411 Generalized anxiety disorder: Secondary | ICD-10-CM

## 2023-01-22 DIAGNOSIS — F439 Reaction to severe stress, unspecified: Secondary | ICD-10-CM

## 2023-01-22 DIAGNOSIS — G4700 Insomnia, unspecified: Secondary | ICD-10-CM

## 2023-02-11 ENCOUNTER — Encounter: Payer: Self-pay | Admitting: Family Medicine

## 2023-02-11 ENCOUNTER — Ambulatory Visit (INDEPENDENT_AMBULATORY_CARE_PROVIDER_SITE_OTHER): Payer: Medicare Other | Admitting: Family Medicine

## 2023-02-11 VITALS — BP 122/80 | HR 56 | Resp 18 | Ht 69.0 in | Wt 157.0 lb

## 2023-02-11 DIAGNOSIS — Z131 Encounter for screening for diabetes mellitus: Secondary | ICD-10-CM

## 2023-02-11 DIAGNOSIS — E785 Hyperlipidemia, unspecified: Secondary | ICD-10-CM | POA: Diagnosis not present

## 2023-02-11 DIAGNOSIS — F411 Generalized anxiety disorder: Secondary | ICD-10-CM | POA: Diagnosis not present

## 2023-02-11 DIAGNOSIS — I159 Secondary hypertension, unspecified: Secondary | ICD-10-CM

## 2023-02-11 DIAGNOSIS — E559 Vitamin D deficiency, unspecified: Secondary | ICD-10-CM

## 2023-02-11 DIAGNOSIS — I480 Paroxysmal atrial fibrillation: Secondary | ICD-10-CM | POA: Diagnosis not present

## 2023-02-11 MED ORDER — ESCITALOPRAM OXALATE 5 MG PO TABS: 5.0000 mg | ORAL_TABLET | Freq: Every day | ORAL | 3 refills | Status: DC

## 2023-02-11 NOTE — Progress Notes (Signed)
Established Patient Office Visit  Subjective   Patient ID: Deborah Farmer, female    DOB: May 26, 1955  Age: 67 y.o. MRN: [id number]  Chief Complaint  Patient presents with   Hyperlipidemia   Hypertension    HPI Deborah Farmer is a 67 y.o. female presenting today for follow up of hypertension, hyperlipidemia.  At most recent appointment with cardiology, recommended CAC, patient would like a location that is in [place] rather than in [place] where her cardiologist is located. Hypertension:  Pt denies chest pain, SOB, dizziness, edema, syncope, fatigue or heart palpitations. Taking atenolol, reports excellent compliance with treatment. Denies side effects. Hyperlipidemia: tolerating higher dose of rosuvastatin 10 mg daily well with no myalgias or significant side effects. Currently consuming a general diet.  She would like to get back into a more regular exercise routine. The 10-year ASCVD risk score (Arnett DK, et al., 2019) is: 8.1%   Outpatient Medications Prior to Visit  Medication Sig   atenolol (TENORMIN) 25 MG tablet Take 25 mg by mouth daily.   flecainide (TAMBOCOR) 100 MG tablet Take 1 tablet by mouth 2 (two) times daily.   Lactobacillus Rhamnosus, GG, (CULTURELLE) CAPS Take 1 capsule by mouth daily.   methocarbamol (ROBAXIN) 750 MG tablet Take 1 tablet (750 mg total) by mouth every 8 (eight) hours as needed for muscle spasms.   Multiple Vitamins-Minerals (MULTIVITAMIN ADULT PO) Take 1 tablet by mouth daily.   pantoprazole (PROTONIX) 40 MG tablet Take 40 mg by mouth daily.   rosuvastatin (CRESTOR) 10 MG tablet Take 10 mg by mouth daily.   valACYclovir (VALTREX) 1000 MG tablet Take 2 tablets by mouth twice daily for 1 dose.   [DISCONTINUED] atenolol (TENORMIN) 25 MG tablet TAKE 1 TABLET (25 MG TOTAL) BY MOUTH DAILY.   [DISCONTINUED] escitalopram (LEXAPRO) 10 MG tablet TAKE 1/2 TABLET(5 MG) BY MOUTH DAILY   [DISCONTINUED] famotidine (PEPCID) 20 MG tablet Take 20 mg by mouth 2  (two) times daily as needed.   [DISCONTINUED] benzonatate (TESSALON) 100 MG capsule Take 1 capsule (100 mg total) by mouth 2 (two) times daily as needed for cough. (Patient not taking: Reported on 08/12/2022)   [DISCONTINUED] guaiFENesin (MUCINEX) 600 MG 12 hr tablet Take 1 tablet (600 mg total) by mouth 2 (two) times daily as needed for cough.   [DISCONTINUED] rosuvastatin (CRESTOR) 5 MG tablet Take 1 tablet (5 mg total) by mouth daily.   No facility-administered medications prior to visit.    ROS Negative unless otherwise noted in HPI   Objective:     BP 122/80 (BP Location: Left Arm, Patient Position: Sitting, Cuff Size: Normal)   Pulse (!) 56   Resp 18   Ht 5\' 9"  (1.753 m)   Wt 157 lb (71.2 kg)   SpO2 99%   BMI 23.18 kg/m   Physical Exam Constitutional:      General: She is not in acute distress.    Appearance: Normal appearance.  HENT:     Head: Normocephalic and atraumatic.  Cardiovascular:     Rate and Rhythm: Normal rate and regular rhythm.     Heart sounds: No murmur heard.    No friction rub. No gallop.  Pulmonary:     Effort: Pulmonary effort is normal. No respiratory distress.     Breath sounds: No wheezing, rhonchi or rales.  Skin:    General: Skin is warm and dry.  Neurological:     Mental Status: She is alert and oriented to person, place, and  time.     Assessment & Plan:  Secondary hypertension Assessment & Plan: BP goal <130/80.  Stable, essentially at goal.  Continue routine follow-up with cardiology.  Continue atenolol 25 mg daily and flecainide 100 mg twice daily as prescribed by cardiologist.  Collecting CBC and CMP for medication monitoring.  Will continue to monitor and coordinate care with cardiology.  Orders: -     CBC with Differential/Platelet; Future -     Comprehensive metabolic panel; Future  Hyperlipidemia, unspecified hyperlipidemia type Assessment & Plan: Lipid panel: LDL 112, HDL 55, triglycerides 103.  Cardiologist increased  rosuvastatin dosage, continue rosuvastatin 10 mg daily.  Rechecking lipid panel and hepatic function today.  Continue with lifestyle interventions as well.  Provided information for locations to obtain CAC testing as recommended by cardiology.  Orders: -     CBC with Differential/Platelet; Future -     Comprehensive metabolic panel; Future -     Lipid panel; Future  GAD (generalized anxiety disorder) Assessment & Plan: Stable.  Continue Lexapro 5 mg daily.  Will continue to monitor.  Orders: -     Escitalopram Oxalate; Take 1 tablet (5 mg total) by mouth daily.  Dispense: 90 tablet; Refill: 3  Paroxysmal atrial fibrillation [facility]) Assessment & Plan: Followed by cardiology, reviewed most recent note.  Continue atenolol and flecainide as prescribed by cardiology.     Return in about 6 months (around 08/12/2023) for follow-up for HTN, HLD, fasting blood work 1 week before.    Melida Quitter, PA

## 2023-02-11 NOTE — Assessment & Plan Note (Signed)
Stable.  Continue Lexapro 5 mg daily.  Will continue to monitor.

## 2023-02-11 NOTE — Assessment & Plan Note (Signed)
Followed by cardiology, reviewed most recent note.  Continue atenolol and flecainide as prescribed by cardiology.

## 2023-02-11 NOTE — Assessment & Plan Note (Signed)
Lipid panel: LDL 112, HDL 55, triglycerides 103.  Cardiologist increased rosuvastatin dosage, continue rosuvastatin 10 mg daily.  Rechecking lipid panel and hepatic function today.  Continue with lifestyle interventions as well.  Provided information for locations to obtain CAC testing as recommended by cardiology.

## 2023-02-11 NOTE — Patient Instructions (Addendum)
They do make 5 mg tablets of escitalopram, so I sent those in for you to make it a bit easier!  Coronary calcium score locations in [facility]: [facility] at [facility] [phone] [address] [place], [place] [address]  [facility] [phone] [address] [place], [place] [address]

## 2023-02-11 NOTE — Assessment & Plan Note (Signed)
BP goal <130/80.  Stable, essentially at goal.  Continue routine follow-up with cardiology.  Continue atenolol 25 mg daily and flecainide 100 mg twice daily as prescribed by cardiologist.  Collecting CBC and CMP for medication monitoring.  Will continue to monitor and coordinate care with cardiology.

## 2023-02-12 LAB — CBC WITH DIFFERENTIAL/PLATELET
Basophils Absolute: 0 10*3/uL (ref 0.0–0.2)
Basos: 1 %
EOS (ABSOLUTE): 0.1 10*3/uL (ref 0.0–0.4)
Eos: 1 %
Hematocrit: 39 % (ref 34.0–46.6)
Hemoglobin: 12.8 g/dL (ref 11.1–15.9)
Immature Grans (Abs): 0 10*3/uL (ref 0.0–0.1)
Immature Granulocytes: 0 %
Lymphocytes Absolute: 1.4 10*3/uL (ref 0.7–3.1)
Lymphs: 33 %
MCH: 30.4 pg (ref 26.6–33.0)
MCHC: 32.8 g/dL (ref 31.5–35.7)
MCV: 93 fL (ref 79–97)
Monocytes Absolute: 0.3 10*3/uL (ref 0.1–0.9)
Monocytes: 7 %
Neutrophils Absolute: 2.5 10*3/uL (ref 1.4–7.0)
Neutrophils: 58 %
Platelets: 196 10*3/uL (ref 150–450)
RBC: 4.21 x10E6/uL (ref 3.77–5.28)
RDW: 12.3 % (ref 11.7–15.4)
WBC: 4.3 10*3/uL (ref 3.4–10.8)

## 2023-02-12 LAB — COMPREHENSIVE METABOLIC PANEL
ALT: 21 [IU]/L (ref 0–32)
AST: 28 [IU]/L (ref 0–40)
Albumin: 4.8 g/dL (ref 3.9–4.9)
Alkaline Phosphatase: 60 [IU]/L (ref 44–121)
BUN/Creatinine Ratio: 17 (ref 12–28)
BUN: 14 mg/dL (ref 8–27)
Bilirubin Total: 0.7 mg/dL (ref 0.0–1.2)
CO2: 24 mmol/L (ref 20–29)
Calcium: 9.4 mg/dL (ref 8.7–10.3)
Chloride: 103 mmol/L (ref 96–106)
Creatinine, Ser: 0.84 mg/dL (ref 0.57–1.00)
Globulin, Total: 2.2 g/dL (ref 1.5–4.5)
Glucose: 90 mg/dL (ref 70–99)
Potassium: 4.2 mmol/L (ref 3.5–5.2)
Sodium: 142 mmol/L (ref 134–144)
Total Protein: 7 g/dL (ref 6.0–8.5)
eGFR: 76 mL/min/{1.73_m2} (ref >59)

## 2023-02-12 LAB — LIPID PANEL
Chol/HDL Ratio: 3.5 {ratio} (ref 0.0–4.4)
Cholesterol, Total: 204 mg/dL — ABNORMAL HIGH (ref 100–199)
HDL: 58 mg/dL (ref >39)
LDL Chol Calc (NIH): 127 mg/dL — ABNORMAL HIGH (ref 0–99)
Triglycerides: 104 mg/dL (ref 0–149)
VLDL Cholesterol Cal: 19 mg/dL (ref 5–40)

## 2023-03-29 ENCOUNTER — Other Ambulatory Visit: Payer: Self-pay | Admitting: Family Medicine

## 2023-03-29 DIAGNOSIS — F411 Generalized anxiety disorder: Secondary | ICD-10-CM

## 2023-03-29 DIAGNOSIS — F439 Reaction to severe stress, unspecified: Secondary | ICD-10-CM

## 2023-03-29 DIAGNOSIS — G4700 Insomnia, unspecified: Secondary | ICD-10-CM

## 2023-04-16 ENCOUNTER — Encounter: Payer: Self-pay | Admitting: Family Medicine

## 2023-05-14 ENCOUNTER — Ambulatory Visit: Payer: Medicare Other

## 2023-05-14 DIAGNOSIS — Z0000 Encounter for general adult medical examination without abnormal findings: Secondary | ICD-10-CM | POA: Diagnosis not present

## 2023-05-14 NOTE — Patient Instructions (Signed)
 Deborah Farmer , Thank you for taking time to come for your Medicare Wellness Visit. I appreciate your ongoing commitment to your health goals. Please review the following plan we discussed and let me know if I can assist you in the future.   Referrals/Orders/Follow-Ups/Clinician Recommendations: none  This is a list of the screening recommended for you and due dates:  Health Maintenance  Topic Date Due   COVID-19 Vaccine (5 - 2024-25 season) 11/09/2022   Medicare Annual Wellness Visit  05/13/2024   Mammogram  07/07/2024   DTaP/Tdap/Td vaccine (2 - Td or Tdap) 05/05/2026   Colon Cancer Screening  01/10/2027   Pneumonia Vaccine  Completed   Flu Shot  Completed   DEXA scan (bone density measurement)  Completed   Hepatitis C Screening  Completed   Zoster (Shingles) Vaccine  Completed   HPV Vaccine  Aged Out    Advanced directives: (Copy Requested) Please bring a copy of your health care power of attorney and living will to the office to be added to your chart at your convenience.  Next Medicare Annual Wellness Visit scheduled for next year: Yes  insert Preventive Care attachment Insert FALL PREVENTION attachment if needed

## 2023-05-14 NOTE — Progress Notes (Signed)
 Subjective:   Deborah Farmer is a 68 y.o. who presents for a Medicare Wellness preventive visit.  Visit Complete: Virtual I connected with  Deborah Farmer on 05/14/23 by a audio enabled telemedicine application and verified that I am speaking with the correct person using two identifiers.  Patient Location: Home  Provider Location: Home Office  I discussed the limitations of evaluation and management by telemedicine. The patient expressed understanding and agreed to proceed.  Vital Signs: Because this visit was a virtual/telehealth visit, some criteria may be missing or patient reported. Any vitals not documented were not able to be obtained and vitals that have been documented are patient reported.  VideoError- Librarian, academic were attempted between this provider and patient, however failed, due to patient having technical difficulties OR patient did not have access to video capability.  We continued and completed visit with audio only.   AWV Questionnaire: Yes: Patient Medicare AWV questionnaire was completed by the patient on 05/10/2023; I have confirmed that all information answered by patient is correct and no changes since this date.  Cardiac Risk Factors include: advanced age (>39men, >37 women);dyslipidemia;hypertension     Objective:    Today's Vitals   There is no height or weight on file to calculate BMI.     05/14/2023    4:10 PM 04/27/2018    2:07 PM  Advanced Directives  Does Patient Have a Medical Advance Directive? Yes Yes  Type of Estate agent of [place];Living will Healthcare Power of [place];Living will  Copy of Healthcare Power of Attorney in Chart? No - copy requested     Current Medications (verified) Outpatient Encounter Medications as of 05/14/2023  Medication Sig   atenolol (TENORMIN) 25 MG tablet Take 25 mg by mouth daily.   escitalopram (LEXAPRO) 5 MG tablet Take 1 tablet (5 mg total) by mouth  daily.   flecainide (TAMBOCOR) 100 MG tablet Take 1 tablet by mouth 2 (two) times daily.   Lactobacillus Rhamnosus, GG, (CULTURELLE) CAPS Take 1 capsule by mouth daily.   methocarbamol (ROBAXIN) 750 MG tablet Take 1 tablet (750 mg total) by mouth every 8 (eight) hours as needed for muscle spasms.   Multiple Vitamins-Minerals (MULTIVITAMIN ADULT PO) Take 1 tablet by mouth daily.   pantoprazole (PROTONIX) 40 MG tablet Take 40 mg by mouth daily.   rosuvastatin (CRESTOR) 10 MG tablet Take 10 mg by mouth daily.   valACYclovir (VALTREX) 1000 MG tablet Take 2 tablets by mouth twice daily for 1 dose.   No facility-administered encounter medications on file as of 05/14/2023.    Allergies (verified) Patient has no known allergies.   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   Past Surgical History:  Procedure Laterality Date   AUGMENTATION MAMMAPLASTY Bilateral 2006   BREAST BIOPSY Left 1997   BREAST SURGERY     biopsy and augmentation   CATARACT EXTRACTION Bilateral 03/2021   COSMETIC SURGERY     EYE SURGERY     Family History  Problem Relation Age of Onset   Heart attack Mother    Hypertension Mother    Congestive Heart Failure Mother    Heart disease Mother    Hyperlipidemia Mother    Heart attack Father    Heart disease Father  Kidney disease Father    Diabetes Sister    Breast cancer Paternal Grandmother    Diabetes Brother    Hyperlipidemia Brother    Hyperlipidemia Sister    Social History   Socioeconomic History   Marital status: Married    Spouse name: Not on file   Number of children: Not on file   Years of education: Not on file   Highest education level: Bachelor's degree (e.g., BA, AB, BS)  Occupational History   Not on file  Tobacco Use   Smoking status: Never    Passive  exposure: Never   Smokeless tobacco: Never  Vaping Use   Vaping status: Never Used  Substance and Sexual Activity   Alcohol use: Yes    Alcohol/week: 6.0 standard drinks of alcohol    Types: 6 Glasses of wine per week   Drug use: Never   Sexual activity: Yes    Birth control/protection: Post-menopausal, None  Other Topics Concern   Not on file  Social History Narrative   Not on file   Social Drivers of Health   Financial Resource Strain: Low Risk  (05/10/2023)   Overall Financial Resource Strain (CARDIA)    Difficulty of Paying Living Expenses: Not hard at all  Food Insecurity: No Food Insecurity (05/10/2023)   Hunger Vital Sign    Worried About Running Out of Food in the Last Year: Never true    Ran Out of Food in the Last Year: Never true  Transportation Needs: No Transportation Needs (05/10/2023)   PRAPARE - Administrator, Civil Service (Medical): No    Lack of Transportation (Non-Medical): No  Physical Activity: Insufficiently Active (05/10/2023)   Exercise Vital Sign    Days of Exercise per Week: 2 days    Minutes of Exercise per Session: 50 min  Stress: Stress Concern Present (05/10/2023)   [employer] of Occupational Health - Occupational Stress Questionnaire    Feeling of Stress : To some extent  Social Connections: Moderately Isolated (05/10/2023)   Social Connection and Isolation Panel [NHANES]    Frequency of Communication with Friends and Family: Three times a week    Frequency of Social Gatherings with Friends and Family: Three times a week    Attends Religious Services: Never    Active Member of Clubs or Organizations: No    Attends Engineer, structural: Not on file    Marital Status: Married    Tobacco Counseling Counseling given: Not Answered    Clinical Intake:  Pre-visit preparation completed: Yes  Pain : No/denies pain     Nutritional Risks: None Diabetes: No  How often do you need to have someone help you when you read  instructions, pamphlets, or other written materials from your doctor or pharmacy?: 1 - Never  Interpreter Needed?: No  Information entered by :: NAllen LPN   Activities of Daily Living     05/14/2023    4:06 PM  In your present state of health, do you have any difficulty performing the following activities:  Hearing? 0  Vision? 0  Difficulty concentrating or making decisions? 0  Walking or climbing stairs? 0  Dressing or bathing? 0  Doing errands, shopping? 0  Preparing Food and eating ? N  Using the Toilet? N  In the past six months, have you accidently leaked urine? Y  Do you have problems with loss of bowel control? N  Managing your Medications? N  Managing your Finances? N  Housekeeping or managing  your Housekeeping? N    Patient Care Team: Melida Quitter, PA as PCP - General (Family Medicine) Purnell Shoemaker, MD as Referring Physician (Obstetrics and Gynecology) Otho Najjar, MD as Referring Physician (Cardiothoracic Surgery)  Indicate any recent Medical Services you may have received from other than [facility] providers in the past year (date may be approximate).     Assessment:   This is a routine wellness examination for [place].  Hearing/Vision screen Hearing Screening - Comments:: Denies hearing issues Vision Screening - Comments:: Regular eye exams, [facility]   Goals Addressed             This Visit's Progress    Patient Stated       05/14/2023, wants to do more exercising and eat healthier       Depression Screen     05/14/2023    4:12 PM 02/11/2023    9:32 AM 08/12/2022    9:32 AM 05/13/2022    4:21 PM 01/14/2022   11:04 AM 11/27/2021    9:18 AM 05/27/2021    9:15 AM  PHQ 2/9 Scores  PHQ - 2 Score 0 0 0 0 0 0 0  PHQ- 9 Score 1 0 0  1 1 1     Fall Risk     05/14/2023    4:11 PM 05/13/2022    4:24 PM 05/11/2022    3:54 PM 01/14/2022   11:05 AM 11/27/2021    9:17 AM  Fall Risk   Falls in the past year? 0 0 0 0 0  Number falls in past yr: 0 0  0 0  Injury  with Fall? 0 0 0 0 0  Risk for fall due to : Medication side effect    No Fall Risks  Follow up Falls prevention discussed;Falls evaluation completed    Falls evaluation completed    MEDICARE RISK AT HOME:  Medicare Risk at Home Any stairs in or around the home?: Yes If so, are there any without handrails?: No Home free of loose throw rugs in walkways, pet beds, electrical cords, etc?: Yes Adequate lighting in your home to reduce risk of falls?: Yes Life alert?: No Use of a cane, walker or w/c?: No Grab bars in the bathroom?: No Shower chair or bench in shower?: Yes Elevated toilet seat or a handicapped toilet?: Yes  TIMED UP AND GO:  Was the test performed?  No  Cognitive Function: 6CIT completed        05/14/2023    4:12 PM 05/13/2022    3:50 PM  6CIT Screen  What Year? 0 points 0 points  What month? 0 points 0 points  What time? 0 points 0 points  Count back from 20 0 points 0 points  Months in reverse 0 points 0 points  Repeat phrase 0 points 0 points  Total Score 0 points 0 points    Immunizations Immunization History  Administered Date(s) Administered   Influenza, Quadrivalent, Recombinant, Inj, Pf 12/07/2018   Influenza-Unspecified 01/18/2020, 12/27/2020, 12/27/2021, 01/23/2023   PFIZER(Purple Top)SARS-COV-2 Vaccination 04/23/2019, 05/17/2019, 01/27/2020   PNEUMOCOCCAL CONJUGATE-20 12/27/2020   Pfizer Covid-19 Vaccine Bivalent Booster 68yrs & up 11/27/2020   Tdap 05/05/2016   Zoster Recombinant(Shingrix) 10/18/2018, 03/16/2019   Zoster, Live 01/03/2016    Screening Tests Health Maintenance  Topic Date Due   COVID-19 Vaccine (5 - 2024-25 season) 11/09/2022   Medicare Annual Wellness (AWV)  05/13/2024   MAMMOGRAM  07/07/2024   DTaP/Tdap/Td (2 - Td or Tdap) 05/05/2026   Colonoscopy  01/10/2027   Pneumonia Vaccine 59+ Years old  Completed   INFLUENZA VACCINE  Completed   DEXA SCAN  Completed   Hepatitis C Screening  Completed   Zoster Vaccines- Shingrix   Completed   HPV VACCINES  Aged Out    Health Maintenance  Health Maintenance Due  Topic Date Due   COVID-19 Vaccine (5 - 2024-25 season) 11/09/2022   Health Maintenance Items Addressed: Up to date  Additional Screening:  Vision Screening: Recommended annual ophthalmology exams for early detection of glaucoma and other disorders of the eye.  Dental Screening: Recommended annual dental exams for proper oral hygiene  Community Resource Referral / Chronic Care Management: CRR required this visit?  No   CCM required this visit?  No     Plan:     I have personally reviewed and noted the following in the patient's chart:   Medical and social history Use of alcohol, tobacco or illicit drugs  Current medications and supplements including opioid prescriptions. Patient is not currently taking opioid prescriptions. Functional ability and status Nutritional status Physical activity Advanced directives List of other physicians Hospitalizations, surgeries, and ER visits in previous 12 months Vitals Screenings to include cognitive, depression, and falls Referrals and appointments  In addition, I have reviewed and discussed with patient certain preventive protocols, quality metrics, and best practice recommendations. A written personalized care plan for preventive services as well as general preventive health recommendations were provided to patient.     Barb Merino, LPN   03/18/1476   After Visit Summary: (MyChart) Due to this being a telephonic visit, the after visit summary with patients personalized plan was offered to patient via MyChart   Notes: Nothing significant to report at this time.

## 2023-06-02 ENCOUNTER — Other Ambulatory Visit (HOSPITAL_COMMUNITY): Payer: Self-pay

## 2023-06-02 DIAGNOSIS — I493 Ventricular premature depolarization: Secondary | ICD-10-CM

## 2023-06-02 DIAGNOSIS — I480 Paroxysmal atrial fibrillation: Secondary | ICD-10-CM

## 2023-06-02 DIAGNOSIS — I4891 Unspecified atrial fibrillation: Secondary | ICD-10-CM

## 2023-06-02 DIAGNOSIS — Z789 Other specified health status: Secondary | ICD-10-CM

## 2023-06-18 ENCOUNTER — Ambulatory Visit (HOSPITAL_COMMUNITY)
Admission: RE | Admit: 2023-06-18 | Discharge: 2023-06-18 | Disposition: A | Discharge status: Home / Self Care | Payer: Self-pay | Source: Ambulatory Visit

## 2023-06-18 DIAGNOSIS — I493 Ventricular premature depolarization: Secondary | ICD-10-CM | POA: Insufficient documentation

## 2023-06-18 DIAGNOSIS — Z789 Other specified health status: Secondary | ICD-10-CM | POA: Insufficient documentation

## 2023-06-18 DIAGNOSIS — I4891 Unspecified atrial fibrillation: Secondary | ICD-10-CM | POA: Insufficient documentation

## 2023-06-18 DIAGNOSIS — I480 Paroxysmal atrial fibrillation: Secondary | ICD-10-CM | POA: Insufficient documentation

## 2023-07-20 ENCOUNTER — Other Ambulatory Visit: Payer: Self-pay | Admitting: Family Medicine

## 2023-07-20 DIAGNOSIS — Z1231 Encounter for screening mammogram for malignant neoplasm of breast: Secondary | ICD-10-CM

## 2023-07-21 ENCOUNTER — Ambulatory Visit
Admission: RE | Admit: 2023-07-21 | Discharge: 2023-07-21 | Disposition: A | Discharge status: Home / Self Care | Source: Ambulatory Visit | Attending: Family Medicine | Admitting: Family Medicine

## 2023-07-21 DIAGNOSIS — Z1231 Encounter for screening mammogram for malignant neoplasm of breast: Secondary | ICD-10-CM

## 2023-08-10 ENCOUNTER — Other Ambulatory Visit: Payer: Self-pay | Admitting: *Deleted

## 2023-08-10 DIAGNOSIS — E559 Vitamin D deficiency, unspecified: Secondary | ICD-10-CM

## 2023-08-10 DIAGNOSIS — Z131 Encounter for screening for diabetes mellitus: Secondary | ICD-10-CM

## 2023-08-10 DIAGNOSIS — I159 Secondary hypertension, unspecified: Secondary | ICD-10-CM

## 2023-08-10 DIAGNOSIS — E785 Hyperlipidemia, unspecified: Secondary | ICD-10-CM

## 2023-08-11 ENCOUNTER — Other Ambulatory Visit: Payer: Medicare Other

## 2023-08-11 DIAGNOSIS — E559 Vitamin D deficiency, unspecified: Secondary | ICD-10-CM

## 2023-08-11 DIAGNOSIS — Z131 Encounter for screening for diabetes mellitus: Secondary | ICD-10-CM

## 2023-08-11 DIAGNOSIS — E785 Hyperlipidemia, unspecified: Secondary | ICD-10-CM

## 2023-08-11 DIAGNOSIS — I159 Secondary hypertension, unspecified: Secondary | ICD-10-CM

## 2023-08-12 ENCOUNTER — Ambulatory Visit: Payer: Self-pay | Admitting: Family Medicine

## 2023-08-12 LAB — LIPID PANEL
Chol/HDL Ratio: 3.6 ratio (ref 0.0–4.4)
Cholesterol, Total: 210 mg/dL — ABNORMAL HIGH (ref 100–199)
HDL: 59 mg/dL (ref >39)
LDL Chol Calc (NIH): 118 mg/dL — ABNORMAL HIGH (ref 0–99)
Triglycerides: 189 mg/dL — ABNORMAL HIGH (ref 0–149)
VLDL Cholesterol Cal: 33 mg/dL (ref 5–40)

## 2023-08-12 LAB — COMPREHENSIVE METABOLIC PANEL WITH GFR
ALT: 26 IU/L (ref 0–32)
AST: 30 IU/L (ref 0–40)
Albumin: 4.5 g/dL (ref 3.9–4.9)
Alkaline Phosphatase: 70 IU/L (ref 44–121)
BUN/Creatinine Ratio: 22 (ref 12–28)
BUN: 16 mg/dL (ref 8–27)
Bilirubin Total: 0.7 mg/dL (ref 0.0–1.2)
CO2: 24 mmol/L (ref 20–29)
Calcium: 9.5 mg/dL (ref 8.7–10.3)
Chloride: 102 mmol/L (ref 96–106)
Creatinine, Ser: 0.72 mg/dL (ref 0.57–1.00)
Globulin, Total: 2.6 g/dL (ref 1.5–4.5)
Glucose: 92 mg/dL (ref 70–99)
Potassium: 4.1 mmol/L (ref 3.5–5.2)
Sodium: 141 mmol/L (ref 134–144)
Total Protein: 7.1 g/dL (ref 6.0–8.5)
eGFR: 92 mL/min/{1.73_m2} (ref >59)

## 2023-08-12 LAB — CBC WITH DIFFERENTIAL/PLATELET
Basophils Absolute: 0 10*3/uL (ref 0.0–0.2)
Basos: 1 %
EOS (ABSOLUTE): 0.1 10*3/uL (ref 0.0–0.4)
Eos: 2 %
Hematocrit: 38.5 % (ref 34.0–46.6)
Hemoglobin: 12.6 g/dL (ref 11.1–15.9)
Immature Grans (Abs): 0 10*3/uL (ref 0.0–0.1)
Immature Granulocytes: 0 %
Lymphocytes Absolute: 1.5 10*3/uL (ref 0.7–3.1)
Lymphs: 36 %
MCH: 31 pg (ref 26.6–33.0)
MCHC: 32.7 g/dL (ref 31.5–35.7)
MCV: 95 fL (ref 79–97)
Monocytes Absolute: 0.3 10*3/uL (ref 0.1–0.9)
Monocytes: 6 %
Neutrophils Absolute: 2.4 10*3/uL (ref 1.4–7.0)
Neutrophils: 55 %
Platelets: 190 10*3/uL (ref 150–450)
RBC: 4.07 x10E6/uL (ref 3.77–5.28)
RDW: 12.3 % (ref 11.7–15.4)
WBC: 4.3 10*3/uL (ref 3.4–10.8)

## 2023-08-12 LAB — HEMOGLOBIN A1C
Est. average glucose Bld gHb Est-mCnc: 100 mg/dL
Hgb A1c MFr Bld: 5.1 % (ref 4.8–5.6)

## 2023-08-12 LAB — VITAMIN D 25 HYDROXY (VIT D DEFICIENCY, FRACTURES): Vit D, 25-Hydroxy: 48.9 ng/mL (ref 30.0–100.0)

## 2023-08-18 ENCOUNTER — Ambulatory Visit: Payer: Medicare Other | Admitting: Family Medicine

## 2023-08-18 ENCOUNTER — Ambulatory Visit (INDEPENDENT_AMBULATORY_CARE_PROVIDER_SITE_OTHER): Admitting: Family Medicine

## 2023-08-18 ENCOUNTER — Encounter: Payer: Self-pay | Admitting: Family Medicine

## 2023-08-18 DIAGNOSIS — E785 Hyperlipidemia, unspecified: Secondary | ICD-10-CM

## 2023-08-18 MED ORDER — ROSUVASTATIN CALCIUM 20 MG PO TABS: 20.0000 mg | ORAL_TABLET | Freq: Every day | ORAL | 1 refills | Status: DC

## 2023-08-18 NOTE — Assessment & Plan Note (Signed)
-   LDL goal <100 mg/dL based on family history - Increase rosuvastatin  from 10 mg to 15 mg daily by adding 5 mg to current dose, then transition to 20 mg daily when current supply depleted - Advised to limit saturated fat intake to <10 g daily - Encouraged to increase aerobic exercise - Repeat lipid panel in 3 months - Follow up with cardiologist in October - Follow up with office in 6 months

## 2023-08-18 NOTE — Patient Instructions (Signed)
 It was nice to see you today,  We addressed the following topics today: -I would like to increase your Crestor  to 20 mg.  I am sending in the 20 mg now.  I would like you to use the 10 mg and 5 mg tablets you have currently to taper up to 20 by taking 15 mg until your old prescriptions run out.  Then you can start the 20 mg tablet if you still tolerate the increased dose. - Schedule a lab visit for 3 months and follow-up with me or kara in 6 months.  Have a great day,  Etha Henle, MD

## 2023-08-18 NOTE — Progress Notes (Unsigned)
   Established Patient Office Visit  Subjective   Patient ID: Deborah Farmer, female    DOB: 1955-05-06  Age: 68 y.o. MRN: [id number]  Chief Complaint  Patient presents with   Medical Management of Chronic Issues    HPI  Subjective - Cholesterol and triglycerides elevated - Concerned about family history of heart disease - Currently taking rosuvastatin  10 mg daily, tolerating well - Previously had muscle pain on 5 mg, was taking 3 days per week with some improvement - Cardiologist recommended increasing to 10 mg in January - Reports good diet habits: eats vegetables, minimal fast food, fried foods, and red meat - Admits to less frequent gym attendance recently - Reports 10 lb weight gain over past year - Calcium  score test showed score of 16 (58th percentile for age/gender)  Medications: rosuvastatin  10 mg daily (cholesterol), previously tried ezetimibe  but discontinued due to worsening muscle aches after 2 weeks.  PMH: Hyperlipidemia.  Social Hx: Exercises at gym but reports decreased frequency recently.  FH: Mother died from heart failure, father had massive heart attack, siblings on cholesterol medication.  ROS: Denies current muscle aches with rosuvastatin  10 mg.    The 10-year ASCVD risk score (Arnett DK, et al., 2019) is: 9.6%  Health Maintenance Due  Topic Date Due   COVID-19 Vaccine (5 - 2024-25 season) 11/09/2022      Objective:     BP 131/77   Pulse 87   Ht 5\' 9"  (1.753 m)   Wt 161 lb 12.8 oz (73.4 kg)   SpO2 100%   BMI 23.89 kg/m  {Vitals History (Optional):23777}  Physical Exam Gen: alert, oriented Pulm: no resp distress Psych: pleasant affect.     No results found for any visits on 08/18/23.      Assessment & Plan:   Hyperlipidemia, unspecified hyperlipidemia type Assessment & Plan: - LDL goal <100 mg/dL based on family history - Increase rosuvastatin  from 10 mg to 15 mg daily by adding 5 mg to current dose, then transition to 20 mg  daily when current supply depleted - Advised to limit saturated fat intake to <10 g daily - Encouraged to increase aerobic exercise - Repeat lipid panel in 3 months - Follow up with cardiologist in October - Follow up with office in 6 months  Orders: -     Rosuvastatin  Calcium ; Take 1 tablet (20 mg total) by mouth daily.  Dispense: 90 tablet; Refill: 1 -     Lipid panel; Future     Return in about 6 months (around 02/17/2024) for hld.    Laneta Pintos, MD

## 2023-11-19 ENCOUNTER — Other Ambulatory Visit

## 2024-01-03 ENCOUNTER — Other Ambulatory Visit: Payer: Self-pay

## 2024-01-03 DIAGNOSIS — E785 Hyperlipidemia, unspecified: Secondary | ICD-10-CM

## 2024-01-06 ENCOUNTER — Other Ambulatory Visit

## 2024-01-06 DIAGNOSIS — E785 Hyperlipidemia, unspecified: Secondary | ICD-10-CM

## 2024-01-07 LAB — COMPREHENSIVE METABOLIC PANEL WITH GFR
ALT: 27 IU/L (ref 0–32)
AST: 33 IU/L (ref 0–40)
Albumin: 4.9 g/dL (ref 3.9–4.9)
Alkaline Phosphatase: 71 IU/L (ref 49–135)
BUN/Creatinine Ratio: 13 (ref 12–28)
BUN: 12 mg/dL (ref 8–27)
Bilirubin Total: 0.7 mg/dL (ref 0.0–1.2)
CO2: 25 mmol/L (ref 20–29)
Calcium: 9.9 mg/dL (ref 8.7–10.3)
Chloride: 102 mmol/L (ref 96–106)
Creatinine, Ser: 0.94 mg/dL (ref 0.57–1.00)
Globulin, Total: 2.7 g/dL (ref 1.5–4.5)
Glucose: 94 mg/dL (ref 70–99)
Potassium: 4.5 mmol/L (ref 3.5–5.2)
Sodium: 142 mmol/L (ref 134–144)
Total Protein: 7.6 g/dL (ref 6.0–8.5)
eGFR: 66 mL/min/1.73 (ref >59)

## 2024-01-07 LAB — LIPID PANEL
Chol/HDL Ratio: 2.7 ratio (ref 0.0–4.4)
Cholesterol, Total: 178 mg/dL (ref 100–199)
HDL: 66 mg/dL (ref >39)
LDL Chol Calc (NIH): 92 mg/dL (ref 0–99)
Triglycerides: 113 mg/dL (ref 0–149)
VLDL Cholesterol Cal: 20 mg/dL (ref 5–40)

## 2024-01-10 ENCOUNTER — Ambulatory Visit: Payer: Self-pay

## 2024-01-20 ENCOUNTER — Other Ambulatory Visit: Payer: Self-pay | Admitting: Family Medicine

## 2024-01-20 DIAGNOSIS — F411 Generalized anxiety disorder: Secondary | ICD-10-CM

## 2024-02-18 ENCOUNTER — Ambulatory Visit

## 2024-03-21 ENCOUNTER — Other Ambulatory Visit: Payer: Self-pay | Admitting: Family Medicine

## 2024-03-21 DIAGNOSIS — E785 Hyperlipidemia, unspecified: Secondary | ICD-10-CM

## 2024-03-24 ENCOUNTER — Ambulatory Visit (INDEPENDENT_AMBULATORY_CARE_PROVIDER_SITE_OTHER)

## 2024-03-24 VITALS — BP 150/84 | HR 61 | Temp 97.4°F | Ht 69.0 in | Wt 161.1 lb

## 2024-03-24 DIAGNOSIS — I4891 Unspecified atrial fibrillation: Secondary | ICD-10-CM

## 2024-03-24 DIAGNOSIS — E785 Hyperlipidemia, unspecified: Secondary | ICD-10-CM | POA: Diagnosis not present

## 2024-03-24 DIAGNOSIS — R072 Precordial pain: Secondary | ICD-10-CM | POA: Diagnosis not present

## 2024-03-24 DIAGNOSIS — I159 Secondary hypertension, unspecified: Secondary | ICD-10-CM | POA: Diagnosis not present

## 2024-03-24 DIAGNOSIS — R079 Chest pain, unspecified: Secondary | ICD-10-CM | POA: Insufficient documentation

## 2024-03-24 NOTE — Patient Instructions (Addendum)
"   VISIT SUMMARY: Today we reviewed your cholesterol management, atrial fibrillation treatment, and occasional chest pain. We also discussed your cardiovascular risk and lifestyle modifications.  YOUR PLAN: HYPERLIPIDEMIA: Your cholesterol levels have improved with the increased dose of rosuvastatin , though you are experiencing some muscle aches. -Continue taking rosuvastatin  20 mg. -We checked your cholesterol and liver function today. -If your cholesterol levels remain high, we may consider adding Repatha.  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 -Monitor the frequency and duration of your chest pain episodes. -If the episodes become more frequent or severe, we will escalate workup.   ATRIAL FIBRILLATION: Your atrial fibrillation is being managed with Eliquis and metoprolol. Your blood pressure is slightly elevated but not hypertensive. -Continue taking Eliquis and metoprolol. -Monitor your blood pressure at home.  GENERAL HEALTH MAINTENANCE: You are up to date on your screenings and vaccinations. You are actively engaging in yoga and tai chi. -Continue your current health maintenance schedule. -Increase your cardio exercise, such as walking or biking.  If you have any problems before your next visit feel free to message me via MyChart (minor issues or questions) or call the office, otherwise you may reach out to schedule an office visit.  Thank you! Saddie Sacks, PA-C  "

## 2024-03-24 NOTE — Assessment & Plan Note (Signed)
 Intermittent sharp chest pain likely due to benign precordial catch syndrome versus costochondritis. Precordial catch more likely given intermittent episodes rather than persistent pain. Cardiac imaging ruled out significant pathology. - Provided information on precordial catch syndrome. - Advised to monitor frequency and duration of episodes. - Consider muscle relaxers or further workup if episodes become more frequent or severe.

## 2024-03-24 NOTE — Progress Notes (Signed)
 "  Established Patient Office Visit  Subjective   Patient ID: Deborah Farmer, female    DOB: 1955-08-01  Age: 69 y.o. MRN: [id number]  Chief Complaint  Patient presents with   Medical Management of Chronic Issues    HPI  History of Present Illness   AAIRAH NEGRETTE is a 69 year old female with hyperlipidemia and atrial fibrillation who presents for follow-up on cholesterol management and medication review.  Hyperlipidemia management - Rosuvastatin  dose increased from 10 mg to 20 mg several months ago  - Tolerating rosuvastatin  well, with some muscle aches. - Cholesterol levels improved to normal range after dose increase. - Currently fasting for cholesterol check today. - Previously trialed ezetimibe , discontinued due to muscle pain.  Follows with Cardiology with [facility] concurrently for management   Atrial fibrillation and cardiac medication review - Recently switched from atenolol  to metoprolol by cardiology. Tolerating well.  - Started on Eliquis for atrial fibrillation. - Continues to take flecainide.  Musculoskeletal chest pain - Occasional sharp, stabbing pain in left breast radiating to back. - Pain is brief, sporadic, and mostly occurs when lying in bed.Happens seldomly/intermittently and unpredictably.  - Episode lasts a few seconds and then totally resolves.  - Pain described as a spasm; pressing on the area reproduces pain.  - Breast ultrasound and recent mammogram were normal.  Cardiovascular risk concern - Family history of cardiovascular disease; sister underwent open heart surgery for three blockages. - Expresses concern about personal cardiovascular health.  Physical activity and lifestyle modification - Actively working to improve exercise routine, including cardio activities. - Participates in yoga and tai chi. - Acknowledges need to increase walking or biking activities.          ROS Per HPI.    Objective:     BP (!) 150/84   Pulse 61   Temp  (!) 97.4 F (36.3 C) (Oral)   Ht 5' 9 (1.753 m)   Wt 161 lb 1.9 oz (73.1 kg)   SpO2 98%   BMI 23.79 kg/m    Physical Exam Constitutional:      General: She is not in acute distress.    Appearance: Normal appearance.  Cardiovascular:     Rate and Rhythm: Normal rate and regular rhythm.     Heart sounds: Normal heart sounds. No murmur heard.    No friction rub. No gallop.  Pulmonary:     Effort: Pulmonary effort is normal. No respiratory distress.     Breath sounds: Normal breath sounds.  Chest:     Chest wall: Tenderness present. No mass, deformity or swelling.    Musculoskeletal:        General: No swelling.       Back:     Comments: Mild TTP under shoulder blade as noted above   Skin:    General: Skin is warm and dry.  Neurological:     General: No focal deficit present.     Mental Status: She is alert.  Psychiatric:        Mood and Affect: Mood normal.        Behavior: Behavior normal.        Thought Content: Thought content normal.      No results found for any visits on 03/24/24.  Last CBC Lab Results  Component Value Date   WBC 4.3 08/11/2023   HGB 12.6 08/11/2023   HCT 38.5 08/11/2023   MCV 95 08/11/2023   MCH 31.0 08/11/2023   RDW 12.3 08/11/2023  PLT 190 08/11/2023   Last metabolic panel Lab Results  Component Value Date   GLUCOSE 94 01/06/2024   NA 142 01/06/2024   K 4.5 01/06/2024   CL 102 01/06/2024   CO2 25 01/06/2024   BUN 12 01/06/2024   CREATININE 0.94 01/06/2024   EGFR 66 01/06/2024   CALCIUM  9.9 01/06/2024   PROT 7.6 01/06/2024   ALBUMIN 4.9 01/06/2024   LABGLOB 2.7 01/06/2024   AGRATIO 2.0 08/21/2022   BILITOT 0.7 01/06/2024   ALKPHOS 71 01/06/2024   AST 33 01/06/2024   ALT 27 01/06/2024   Last lipids Lab Results  Component Value Date   CHOL 178 01/06/2024   HDL 66 01/06/2024   LDLCALC 92 01/06/2024   LDLDIRECT 127 (H) 03/13/2020   TRIG 113 01/06/2024   CHOLHDL 2.7 01/06/2024   Last hemoglobin A1c Lab Results   Component Value Date   HGBA1C 5.1 08/11/2023   Last thyroid functions Lab Results  Component Value Date   TSH 1.540 11/28/2020   Last vitamin D  Lab Results  Component Value Date   VD25OH 48.9 08/11/2023      The 10-year ASCVD risk score (Arnett DK, et al., 2019) is: 12.6%    Assessment & Plan:   Hyperlipidemia, unspecified hyperlipidemia type Assessment & Plan: Last lipid panel: LDL 92, HDL 66, Trig 113. The 10-year ASCVD risk score (Arnett DK, et al., 2019) is: 12.6% LDL goal originally <100 but she states cardiology has set a goal of LDL <70.  Rechecking lipid panel and CMP with labs today. Patient has been following strict diet and exercise regimen. Discussed Repatha as an option to speak with cardiologist about if LDL remains above goal. Continue rosuvastatin  20 mg daily  Orders: -     Lipid panel; Future -     Comprehensive metabolic panel with GFR; Future  Atrial fibrillation, unspecified type [facility]) Assessment & Plan: Followed by cardiology. Rate controlled on metoprolol and fleccanide. Anticoagulated on Eliquis. Stable. Will cont to monitor.    Secondary hypertension Assessment & Plan: BP goal <130/80. Above goal in office and on recheck today. Does monitor BP at home and at cardiology office and it is usually at goal. Advised to monitor BP at home for the next 7 days and if still elevated above goal, contact us  so we can adjust BP medications. Patient verbalized understanding and was in agreement with the plan.    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 Precordial catch more likely given intermittent episodes rather than persistent pain. Cardiac imaging ruled out significant pathology. - Provided information on precordial catch syndrome. - Advised to monitor frequency and duration of episodes. - Consider muscle relaxers or further workup if episodes become more  frequent or severe.     Return in about 6 months (around 09/21/2024) for HTN, HLD.    Saddie JULIANNA Sacks, PA-C "

## 2024-03-24 NOTE — Assessment & Plan Note (Signed)
 Last lipid panel: LDL 92, HDL 66, Trig 113. The 10-year ASCVD risk score (Arnett DK, et al., 2019) is: 12.6% LDL goal originally <100 but she states cardiology has set a goal of LDL <70.  Rechecking lipid panel and CMP with labs today. Patient has been following strict diet and exercise regimen. Discussed Repatha as an option to speak with cardiologist about if LDL remains above goal. Continue rosuvastatin  20 mg daily

## 2024-03-24 NOTE — Assessment & Plan Note (Signed)
 BP goal <130/80. Above goal in office and on recheck today. Does monitor BP at home and at cardiology office and it is usually at goal. Advised to monitor BP at home for the next 7 days and if still elevated above goal, contact us  so we can adjust BP medications. Patient verbalized understanding and was in agreement with the plan.

## 2024-03-24 NOTE — Assessment & Plan Note (Signed)
 Followed by cardiology. Rate controlled on metoprolol and fleccanide. Anticoagulated on Eliquis. Stable. Will cont to monitor.

## 2024-03-25 LAB — COMPREHENSIVE METABOLIC PANEL WITH GFR
ALT: 27 IU/L (ref 0–32)
AST: 35 IU/L (ref 0–40)
Albumin: 4.7 g/dL (ref 3.9–4.9)
Alkaline Phosphatase: 73 IU/L (ref 49–135)
BUN/Creatinine Ratio: 21 (ref 12–28)
BUN: 16 mg/dL (ref 8–27)
Bilirubin Total: 0.6 mg/dL (ref 0.0–1.2)
CO2: 24 mmol/L (ref 20–29)
Calcium: 9.2 mg/dL (ref 8.7–10.3)
Chloride: 104 mmol/L (ref 96–106)
Creatinine, Ser: 0.76 mg/dL (ref 0.57–1.00)
Globulin, Total: 2.2 g/dL (ref 1.5–4.5)
Glucose: 87 mg/dL (ref 70–99)
Potassium: 4.5 mmol/L (ref 3.5–5.2)
Sodium: 145 mmol/L — ABNORMAL HIGH (ref 134–144)
Total Protein: 6.9 g/dL (ref 6.0–8.5)
eGFR: 85 mL/min/1.73

## 2024-03-25 LAB — LIPID PANEL
Chol/HDL Ratio: 3 ratio (ref 0.0–4.4)
Cholesterol, Total: 184 mg/dL (ref 100–199)
HDL: 62 mg/dL
LDL Chol Calc (NIH): 108 mg/dL — ABNORMAL HIGH (ref 0–99)
Triglycerides: 76 mg/dL (ref 0–149)
VLDL Cholesterol Cal: 14 mg/dL (ref 5–40)

## 2024-03-27 ENCOUNTER — Ambulatory Visit: Payer: Self-pay

## 2024-05-26 ENCOUNTER — Ambulatory Visit

## 2024-09-14 ENCOUNTER — Other Ambulatory Visit

## 2024-09-21 ENCOUNTER — Ambulatory Visit
